# Patient Record
Sex: Male | Born: 1965
Health system: Southern US, Community
[De-identification: ages and names within clinical notes are randomized; demographics above are authoritative.]

---

## 1999-09-07 ENCOUNTER — Emergency Department (HOSPITAL_COMMUNITY): Admission: EM | Admit: 1999-09-07 | Discharge: 1999-09-08 | Payer: Self-pay

## 2001-03-13 ENCOUNTER — Encounter: Admission: RE | Admit: 2001-03-13 | Discharge: 2001-03-13 | Payer: Self-pay | Admitting: General Practice

## 2001-03-13 ENCOUNTER — Encounter: Payer: Self-pay | Admitting: General Practice

## 2001-07-16 ENCOUNTER — Emergency Department (HOSPITAL_COMMUNITY): Admission: EM | Admit: 2001-07-16 | Discharge: 2001-07-17 | Payer: Self-pay | Admitting: Emergency Medicine

## 2008-08-22 ENCOUNTER — Emergency Department (HOSPITAL_COMMUNITY): Admission: EM | Admit: 2008-08-22 | Discharge: 2008-08-22 | Payer: Self-pay | Admitting: Emergency Medicine

## 2009-07-02 ENCOUNTER — Emergency Department (HOSPITAL_COMMUNITY): Admission: EM | Admit: 2009-07-02 | Discharge: 2009-07-02 | Payer: Self-pay | Admitting: Emergency Medicine

## 2009-07-20 ENCOUNTER — Emergency Department (HOSPITAL_COMMUNITY): Admission: EM | Admit: 2009-07-20 | Discharge: 2009-07-20 | Payer: Self-pay | Admitting: Emergency Medicine

## 2009-11-07 ENCOUNTER — Emergency Department (HOSPITAL_COMMUNITY): Admission: EM | Admit: 2009-11-07 | Discharge: 2009-11-07 | Payer: Self-pay | Admitting: Emergency Medicine

## 2013-02-14 ENCOUNTER — Emergency Department (HOSPITAL_COMMUNITY)
Admission: EM | Admit: 2013-02-14 | Discharge: 2013-02-14 | Disposition: A | Discharge status: Home / Self Care | Payer: Self-pay | Attending: Emergency Medicine | Admitting: Emergency Medicine

## 2013-02-14 ENCOUNTER — Encounter (HOSPITAL_COMMUNITY): Payer: Self-pay

## 2013-02-14 DIAGNOSIS — R269 Unspecified abnormalities of gait and mobility: Secondary | ICD-10-CM | POA: Insufficient documentation

## 2013-02-14 DIAGNOSIS — F17200 Nicotine dependence, unspecified, uncomplicated: Secondary | ICD-10-CM | POA: Insufficient documentation

## 2013-02-14 DIAGNOSIS — M722 Plantar fascial fibromatosis: Secondary | ICD-10-CM | POA: Insufficient documentation

## 2013-02-14 DIAGNOSIS — IMO0001 Reserved for inherently not codable concepts without codable children: Secondary | ICD-10-CM | POA: Insufficient documentation

## 2013-02-14 MED ORDER — MELOXICAM 7.5 MG PO TABS: 7.5000 mg | ORAL_TABLET | Freq: Every day | ORAL | Status: DC

## 2013-02-14 MED ORDER — OXYCODONE-ACETAMINOPHEN 5-325 MG PO TABS: 1.0000 | ORAL_TABLET | Freq: Four times a day (QID) | ORAL | Status: DC | PRN

## 2013-02-14 NOTE — ED Provider Notes (Signed)
History    CSN: [id number] Arrival date & time 02/14/13  0930  First MD Initiated Contact with Patient 02/14/13 [phone]     Chief Complaint  Patient presents with  . Foot Pain   (Consider location/radiation/quality/duration/timing/severity/associated sxs/prior Treatment) HPI Comments: Patient presents with complaint of right foot pain for the past 3 weeks. Patient denies injury. Pain radiates to right calf at times. It is on the plantar aspect of his foot. It is worse with ambulation and worse on the first steps in the morning. Patient bought gel insoles which help. Patient took a family members Percocet with some relief. Onset of symptoms acute. Course is intermittent.  Patient is a 47 y.o. male presenting with lower extremity pain. The history is provided by the patient.  Foot Pain Associated symptoms include myalgias. Pertinent negatives include no arthralgias, joint swelling, neck pain, numbness or weakness.   History reviewed. No pertinent past medical history. History reviewed. No pertinent past surgical history. History reviewed. No pertinent family history. History  Substance Use Topics  . Smoking status: Current Every Day Smoker -- 0.50 packs/day    Types: Cigarettes  . Smokeless tobacco: Not on file  . Alcohol Use: 0.6 oz/week    1 Cans of beer per week    Review of Systems  Constitutional: Negative for activity change.  HENT: Negative for neck pain.   Musculoskeletal: Positive for myalgias and gait problem. Negative for back pain, joint swelling and arthralgias.  Skin: Negative for wound.  Neurological: Negative for weakness and numbness.    Allergies  Review of patient's allergies indicates no known allergies.  Home Medications   Current Outpatient Rx  Name  Route  Sig  Dispense  Refill  . meloxicam (MOBIC) 7.5 MG tablet   Oral   Take 1 tablet (7.5 mg total) by mouth daily.   14 tablet   0   . oxyCODONE-acetaminophen (PERCOCET/ROXICET) 5-325 MG per  tablet   Oral   Take 1-2 tablets by mouth every 6 (six) hours as needed for pain.   10 tablet   0    BP 141/99  Pulse 66  Temp(Src) 97.8 F (36.6 C) (Oral)  Resp 18  SpO2 97% Physical Exam  Nursing note and vitals reviewed. Constitutional: He appears well-developed and well-nourished.  HENT:  Head: Normocephalic and atraumatic.  Eyes: Conjunctivae are normal.  Neck: Normal range of motion. Neck supple.  Cardiovascular: Normal pulses.   Musculoskeletal: He exhibits tenderness. He exhibits no edema.       Right ankle: Normal.       Right foot: He exhibits tenderness. He exhibits normal range of motion and no bony tenderness.  Tenderness to palpation of plantar aspect of right foot.  Neurological: He is alert. No sensory deficit.  Motor, sensation, and vascular distal to the injury is fully intact.   Skin: Skin is warm and dry.  Psychiatric: He has a normal mood and affect.    ED Course  Procedures (including critical care time) Labs Reviewed - No data to display No results found. 1. Plantar fasciitis    9:57 AM Patient seen and examined.   Vital signs reviewed and are as follows: Filed Vitals:   02/14/13 0939  BP: 141/99  Pulse: 66  Temp: 97.8 F (36.6 C)  Resp: 18   Patient given post-op shoe per his request. Will give prescription pain med and NSAIDs. Ortho/podiatry f/u given and encouraged.   Patient counseled on use of narcotic pain medications. Counseled not to  combine these medications with others containing tylenol. Urged not to drink alcohol, drive, or perform any other activities that requires focus while taking these medications. The patient verbalizes understanding and agrees with the plan.   MDM  Patient history and exam consistent with plantar fasciitis. Patient prescribed NSAIDs, pain medication. Ortho f/u and conservative management.   Renne Crigler, PA-C 02/14/13 1003

## 2013-02-14 NOTE — ED Notes (Signed)
He c/o plantar area pain radiating up into his post. Lower leg x 1 month.  He is in no distress.  He denies any trauma, nor any unusual recent activity.

## 2013-02-14 NOTE — ED Provider Notes (Signed)
Medical screening examination/treatment/procedure(s) were performed by non-physician practitioner and as supervising physician I was immediately available for consultation/collaboration. Devoria Albe, MD, FACEP    Ward Givens, MD 02/14/13 1010

## 2014-05-01 ENCOUNTER — Encounter (HOSPITAL_COMMUNITY): Payer: Self-pay | Admitting: Emergency Medicine

## 2014-05-01 ENCOUNTER — Emergency Department (INDEPENDENT_AMBULATORY_CARE_PROVIDER_SITE_OTHER)
Admission: EM | Admit: 2014-05-01 | Discharge: 2014-05-01 | Disposition: A | Discharge status: Home / Self Care | Payer: Self-pay | Source: Home / Self Care | Attending: Family Medicine | Admitting: Family Medicine

## 2014-05-01 DIAGNOSIS — M7121 Synovial cyst of popliteal space [Baker], right knee: Secondary | ICD-10-CM

## 2014-05-01 DIAGNOSIS — M7120 Synovial cyst of popliteal space [Baker], unspecified knee: Secondary | ICD-10-CM

## 2014-05-01 MED ORDER — HYDROCODONE-ACETAMINOPHEN 5-325 MG PO TABS: 1.0000 | ORAL_TABLET | Freq: Four times a day (QID) | ORAL | Status: DC | PRN

## 2014-05-01 MED ORDER — PREDNISONE 10 MG PO TABS: ORAL_TABLET | ORAL | Status: DC

## 2014-05-01 NOTE — ED Notes (Signed)
Denies known injury; pt is a Education administrator.  Started with painand swelling in right knee, radiating down right calf, 4 days ago with swelling.  C/O tenderness to posterior knee.  + Homan's.  Has tried ice and Aleve without any relief.

## 2014-05-01 NOTE — ED Provider Notes (Signed)
CSN: [id number]     Arrival date & time 05/01/14  1756 History   First MD Initiated Contact with Patient 05/01/14 1824     Chief Complaint  Patient presents with  . Knee Pain   (Consider location/radiation/quality/duration/timing/severity/associated sxs/prior Treatment) HPI  48 year old male presents complaining of right knee pain. For 4 days he has had progressively worsening right knee pain in the posterior knee and anterior knee just proximal and medial to the patella. He has some swelling in these areas as well. The pain is increased with any ambulation. 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 He denies any specific injury. He denies any recent air travel or long car trips. No history of DVT or PE. No previous history of knee pain or injuries. He is taking ibuprofen without relief.  History reviewed. No pertinent past medical history. History reviewed. No pertinent past surgical history. No family history on file. History  Substance Use Topics  . Smoking status: Current Every Day Smoker -- 0.50 packs/day    Types: Cigarettes  . Smokeless tobacco: Not on file  . Alcohol Use: No    Review of Systems  Musculoskeletal: Positive for arthralgias and joint swelling.       See history of present illness  All other systems reviewed and are negative.   Allergies  Review of patient's allergies indicates no known allergies.  Home Medications   Prior to Admission medications   Medication Sig Start Date End Date Taking? Authorizing Provider  HYDROcodone-acetaminophen (NORCO) 5-325 MG per tablet Take 1 tablet by mouth every 6 (six) hours as needed for severe pain. 05/01/14   Graylon Good, PA-C  ibuprofen (ADVIL,MOTRIN) 200 MG tablet Take 400 mg by mouth every 6 (six) hours as needed for pain.    Historical Provider, MD  meloxicam (MOBIC) 7.5 MG tablet Take 1 tablet (7.5 mg total) by mouth daily. 02/14/13    Renne Crigler, PA-C  oxyCODONE-acetaminophen (PERCOCET/ROXICET) 5-325 MG per tablet Take 1-2 tablets by mouth every 6 (six) hours as needed for pain. 02/14/13   Renne Crigler, PA-C  predniSONE (DELTASONE) 10 MG tablet 4 tabs PO QD for 4 days; 3 tabs PO QD for 3 days; 2 tabs PO QD for 2 days; 1 tab PO QD for 1 day 05/01/14   Graylon Good, PA-C   BP 167/105  Pulse 83  Temp(Src) 99.2 F (37.3 C) (Oral)  Resp 18  SpO2 99% Physical Exam  Nursing note and vitals reviewed. Constitutional: He is oriented to person, place, and time. He appears well-developed and well-nourished. No distress.  HENT:  Head: Normocephalic.  Cardiovascular:  Pulses:      Dorsalis pedis pulses are 2+ on the right side.  No calf tenderness. Negative Homans sign  Pulmonary/Chest: Effort normal. No respiratory distress.  Musculoskeletal:       Right knee: He exhibits swelling (Focal swelling and tenderness in the Popliteal space consistent with popliteal bursitis) and effusion (minimal suprapatellar effusion). He exhibits normal range of motion, no deformity, no erythema, normal alignment, no LCL laxity, no bony tenderness, normal meniscus and no MCL laxity. Tenderness (proximal and medial to the patella) found.       Right lower leg: Normal.  Neurological: He is alert and oriented to person, place, and time. Coordination normal.  Skin: Skin is warm and dry. No rash noted. He is not diaphoretic.  Psychiatric: He has  a normal mood and affect. Judgment normal.    ED Course  Procedures (including critical care time) Labs Review Labs Reviewed - No data to display  Imaging Review No results found.   MDM   1. Lashunta Frieden cyst, right    He has filled in states, will prescribe 10 day course of prednisone, Norco when necessary, ice, rest. Followup with orthopedics if still no improvement.   Meds ordered this encounter  Medications  . predniSONE (DELTASONE) 10 MG tablet    Sig: 4 tabs PO QD for 4 days; 3 tabs PO QD  for 3 days; 2 tabs PO QD for 2 days; 1 tab PO QD for 1 day    Dispense:  30 tablet    Refill:  0    Order Specific Question:  Supervising Provider    Answer:  Lorenz Coaster, DAVID C [id number]  . HYDROcodone-acetaminophen (NORCO) 5-325 MG per tablet    Sig: Take 1 tablet by mouth every 6 (six) hours as needed for severe pain.    Dispense:  15 tablet    Refill:  0    Order Specific Question:  Supervising Provider    Answer:  Lorenz Coaster, DAVID C [[id number]]       Graylon Good, PA-C 05/01/14 2005

## 2014-05-01 NOTE — Discharge Instructions (Signed)
Peter Friedman Cyst °A Peter Friedman cyst is a sac-like structure that forms in the back of the knee. It is filled with the same fluid that is located in your knee. This fluid lubricates the bones and cartilage of the knee and allows them to move over each other more easily. °CAUSES  °When the knee becomes injured or inflamed, increased fluid forms in the knee. When this happens, the joint lining is pushed out behind the knee and forms the Peter Friedman cyst. This cyst may also be caused by inflammation from arthritic conditions and infections. °SIGNS AND SYMPTOMS  °A Peter Friedman cyst usually has no symptoms. When the cyst is substantially enlarged: °· You may feel pressure behind the knee, stiffness in the knee, or a mass in the area behind the knee. °· You may develop pain, redness, and swelling in the calf.  This can suggest a blood clot and requires evaluation by your health care provider. °DIAGNOSIS  °A Peter Friedman cyst is most often found during an ultrasound exam. This exam may have been performed for other reasons, and the cyst was found incidentally. Sometimes an MRI is used. This picks up other problems within a joint that an ultrasound exam may not. If the Peter Friedman cyst developed immediately after an injury, X-ray exams may be used to diagnose the cyst. °TREATMENT  °The treatment depends on the cause of the cyst. Anti-inflammatory medicines and rest often will be prescribed. If the cyst is caused by a bacterial infection, antibiotic medicines may be prescribed.  °HOME CARE INSTRUCTIONS  °· If the cyst was caused by an injury, for the first 24 hours, keep the injured leg elevated on 2 pillows while lying down. °· For the first 24 hours while you are awake, apply ice to the injured area: °¨ Put ice in a plastic bag. °¨ Place a towel between your skin and the bag. °¨ Leave the ice on for 20 minutes, 2-3 times a day. °· Only take over-the-counter or prescription medicines for pain, discomfort, or fever as directed by your health care  provider. °· Only take antibiotic medicine as directed. Make sure to finish it even if you start to feel better. °MAKE SURE YOU:  °· Understand these instructions. °· Will watch your condition. °· Will get help right away if you are not doing well or get worse. °Document Released: 08/05/2005 Document Revised: 05/26/2013 Document Reviewed: 03/17/2013 °Exit[employer]® Patient Information ©2015 [employer]. This information is not intended to replace advice given to you by your health care provider. Make sure you discuss any questions you have with your health care provider. ° °

## 2014-05-02 NOTE — ED Provider Notes (Signed)
Medical screening examination/treatment/procedure(s) were performed by a resident physician or non-physician practitioner and as the supervising physician I was immediately available for consultation/collaboration.  Ameerah Huffstetler, MD Family Medicine   Priyanka Causey J Clement Deneault, MD 05/02/14 0828 

## 2014-06-15 ENCOUNTER — Encounter: Payer: Self-pay | Admitting: Sports Medicine

## 2014-06-15 ENCOUNTER — Ambulatory Visit
Admission: RE | Admit: 2014-06-15 | Discharge: 2014-06-15 | Disposition: A | Discharge status: Home / Self Care | Payer: No Typology Code available for payment source | Source: Ambulatory Visit | Attending: Sports Medicine | Admitting: Sports Medicine

## 2014-06-15 ENCOUNTER — Ambulatory Visit (INDEPENDENT_AMBULATORY_CARE_PROVIDER_SITE_OTHER): Payer: Self-pay | Admitting: Sports Medicine

## 2014-06-15 VITALS — BP 152/101 | HR 91 | Ht 69.0 in | Wt 240.0 lb

## 2014-06-15 DIAGNOSIS — M25561 Pain in right knee: Secondary | ICD-10-CM

## 2014-06-15 MED ORDER — METHYLPREDNISOLONE ACETATE 40 MG/ML IJ SUSP
40.0000 mg | Freq: Once | INTRAMUSCULAR | Status: AC
  Administered 2014-06-15: 40 mg via INTRA_ARTICULAR

## 2014-06-15 NOTE — Progress Notes (Signed)
Peter LeasDarin L Abad - 48 y.o. male MRN [id number]  Date of birth: July 25, 1966  CC & HPI:  The patient presents for evaluation of: Right knee pain: Patient reports a 4+ week onset of right anterior knee pain that is worse with activity. He denies any trauma. 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 He reports occasional clicking and locking but the pain is centrally located in the knee. No significant posterior knee pain. He does check there has been some swelling in the knee. He was seen in the urgent care and diagnosed with popliteal bursitis and started on anti-inflammatories. He denies any prior trauma to his knee or prior surgeries. He does report being active as a teenager but no significant injury. He denies any fevers, chills or recent weight loss  ROS:  Per HPI.   HISTORY: Past Medical, Surgical, Social, and Family History Reviewed & Updated per EMR.  Pertinent Historical Findings include: Otherwise healthy, no diabetes, no current every day medications Works as a Education administratorpainter Current every day smoker   OBJECTIVE FINDINGS:  VS:   HT:5\' 9"  (175.3 cm)   WT:240 lb (108.863 kg)  BMI:35.5          BP:152/101 mmHg  HR:91bpm  TEMP: ( )  RESP:   PHYSICAL EXAM: GENERAL:  adult African-American male. In no discomfort; no respiratory distress   PSYCH: alert and appropriate, good insight   NEURO: Sensation is intact to light touch in bilateral lower extremities   VASCULAR:  right posterior tibialis pulses 2+/4.  No significant edema.   Right knee EXAM: Appearance:  overall normal-appearing, small amount of suprapatellar swelling with no significant effusion, no appreciable Baker's cyst   Skin: No overlying erythema/ecchymosis.  Palpation: TTP over: Medial patellar facet, medial joint line  No TTP over: Lateral joint line, patellar tendon origin or insertion   Strength & ROM:  knee ROM: +5-+120 Weakness in:  Extensor mechanism at 4+/5.  Knee flexion, ankle plantar and dorsiflexion 5+/5  Special Tests:  knee is stable to varus and valgus strain, anterior posterior drawer. Normal Lockman's.       Limited MSK Ultrasound of Right Knee: Findings:  no significant effusion, intact extensor mechanism. No significant disruption in patellar tendon although there is some hypoechoic change along the medial superficial aspect of the origin and insertion. Lateral meniscus is normal appearing with no hypoechoic change. Medial meniscus is degenerative appearing with significant hypoechoic change that is tracking along the MCL. MCL intact.   Impression: The above findings are consistent with degenerative medial meniscus tear    ASSESSMENT: 1. Right knee pain   Likely degenerative meniscus  PROCEDURE NOTE : Right Knee Intra-articular Injection After discussing the risks, benefits and expected outcomes of the injection and all questions were reviewed and answered,  he wished to undergo the above named procedure.  Written consent was obtained. After an appropriate time out was taken the right knee was sterilely prepped and injected as below: Prep:    Betadine and alcohol,  Ethel chloride.  Approach:  Direct bent knee lateral portal Needle:  22g 1.5inch Meds:   3cc 1% lidocaine; 1cc 40mg  depomedrol A bandaid was applied to the area. This procedure was well tolerated and there were no complications.    PLAN: See problem based charting & AVS for additional documentation. - Intra-articular injection today. - Plain film x-rays to evaluate for underlying degenerative changes. -  If not improved will need MRI and consideration of arthroscopic debridement. > Return in about 4 weeks (around 07/13/2014).

## 2014-07-13 ENCOUNTER — Encounter: Payer: Self-pay | Admitting: Sports Medicine

## 2014-07-13 ENCOUNTER — Ambulatory Visit (INDEPENDENT_AMBULATORY_CARE_PROVIDER_SITE_OTHER): Payer: No Typology Code available for payment source | Admitting: Sports Medicine

## 2014-07-13 VITALS — BP 151/89 | HR 73 | Ht 69.0 in | Wt 240.0 lb

## 2014-07-13 DIAGNOSIS — G8929 Other chronic pain: Secondary | ICD-10-CM | POA: Insufficient documentation

## 2014-07-13 DIAGNOSIS — M25561 Pain in right knee: Secondary | ICD-10-CM

## 2014-07-13 MED ORDER — TRAMADOL HCL 50 MG PO TABS: 50.0000 mg | ORAL_TABLET | Freq: Four times a day (QID) | ORAL | Status: DC | PRN

## 2014-07-13 NOTE — Progress Notes (Signed)
  Arbutus LeasDarin L Friedman - 48 y.o. male MRN [id number]  Date of birth: 1965-09-11  CC & HPI:  Sahil presents for follow-up of: Right medial knee pain: Overall reports 50% improvement following intra-articular injection but still continuing to have mechanical symptoms including giving way. Denies locking or clicking. Denies fevers, chills or significant swelling of the knee. Has been performing quadriceps strengthening HEP  ROS:  Per HPI.   HISTORY: Past Medical, Surgical, Social, and Family History Reviewed & Updated per EMR.  Pertinent Historical Findings include: Otherwise relatively healthy. Current everyday smoker. 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  Historical Data Reviewed: Weight bearing 4 view x-ray of the knee reviewed today: No significant degenerative changes there is early spurring of the patellofemoral groove. There is no significant loss of joint space.   OBJECTIVE:  VS:   HT:5\' 9"  (175.3 cm)   WT:240 lb (108.863 kg)  BMI:35.5          BP:(!) 151/89 mmHg  HR:73bpm  TEMP: ( )  RESP:   PHYSICAL EXAM: GENERAL:  adult African-American male. In no discomfort; no respiratory distress   PSYCH: alert and appropriate, good insight   NEURO: sensation is intact to light touch in right lower extremity   VASCULAR: R PT pulse 2+/4.  No significant edema.   Right knee EXAM: Overall normal-appearing, well aligned.. No overlying erythema/ecchymosis.  TTP over: Medial joint line.  No TTP over: Lateral joint line, gastroc or hamstring insertion. No Pez TTP.  Extensor mechanism 5/5, flexor mechanism 4/5. Positive Thessaly and McMurray's. Stable to varus and valgus strain.    Limited MSK Ultrasound of Right Knee: Findings:  hypoechoic change and mushroom sign of medial meniscus   Impression: The above findings are consistent with degenerative medial meniscus tear     ASSESSMENT: 1. Right knee pain   Likely medial meniscus tear with mechanical symptoms  PLAN: See  problem based charting & AVS for additional documentation.  MRI of the need to better characterize medial meniscus.  PT referral to work on continued quad strengthening and hamstring strengthening given slightly weak knee flexion.  Prescription for tramadol to be used on a when necessary basis. Discussed will not be escalating this therapy as chronic opioids are not appropriate for this condition.  Cautioned on mechanical symptoms interfering with daily activities especially while on a ladder and patient aware of this risk. > If MRI does show definitive tearing will try to set up referral to orthopedics however given [facility] options are somewhat limited.  > Return in about 6 weeks (around 08/24/2014).

## 2014-07-18 ENCOUNTER — Ambulatory Visit
Admission: RE | Admit: 2014-07-18 | Discharge: 2014-07-18 | Disposition: A | Discharge status: Home / Self Care | Payer: No Typology Code available for payment source | Source: Ambulatory Visit | Attending: Sports Medicine | Admitting: Sports Medicine

## 2014-07-18 DIAGNOSIS — M25561 Pain in right knee: Secondary | ICD-10-CM

## 2014-08-24 ENCOUNTER — Ambulatory Visit (INDEPENDENT_AMBULATORY_CARE_PROVIDER_SITE_OTHER): Payer: No Typology Code available for payment source | Admitting: Sports Medicine

## 2014-08-24 ENCOUNTER — Encounter: Payer: Self-pay | Admitting: Sports Medicine

## 2014-08-24 VITALS — BP 151/98 | Ht 69.0 in | Wt 240.0 lb

## 2014-08-24 DIAGNOSIS — M23231 Derangement of other medial meniscus due to old tear or injury, right knee: Secondary | ICD-10-CM

## 2014-08-24 DIAGNOSIS — M25561 Pain in right knee: Secondary | ICD-10-CM

## 2014-08-24 DIAGNOSIS — M23203 Derangement of unspecified medial meniscus due to old tear or injury, right knee: Secondary | ICD-10-CM

## 2014-08-24 DIAGNOSIS — Z720 Tobacco use: Secondary | ICD-10-CM

## 2014-08-24 NOTE — Progress Notes (Signed)
  Arbutus LeasDarin L Creamer - 49 y.o. male MRN [id number]  Date of birth: 06/19/1966  CC & HPI:  Peter Friedman presents for follow-up of: Right medial knee pain: Overall reports 50% improvement following intra-articular injection but still continuing to have mechanical symptoms including giving way. Denies locking or clicking. Denies fevers, chills or significant swelling of the knee. Has been performing quadriceps strengthening HEP  ROS:  Per HPI.   HISTORY: Past Medical, Surgical, Social, and Family History Reviewed & Updated per EMR.  Pertinent Historical Findings include: Otherwise relatively healthy. Current everyday smoker. 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  Historical Data Reviewed: Weight bearing 4 view x-ray of the knee reviewed today: No significant degenerative changes there is early spurring of the patellofemoral groove. There is no significant loss of joint space.   MRI 07/18/14: Mid body and posterior on medial meniscal tear. 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  OBJECTIVE:  VS:   HT:5\' 9"  (175.3 cm)   WT:240 lb (108.863 kg)  BMI:35.5          BP:(!) 151/98 mmHg  HR: bpm  TEMP: ( )  RESP:   PHYSICAL EXAM: GENERAL: adult African-American male. In no discomfort; no respiratory distress   PSYCH: alert and appropriate, good insight   NEURO: sensation is intact to light touch in right lower extremity   VASCULAR: R PT pulse 2+/4.  No significant edema.   Right knee EXAM: Overall normal-appearing, well aligned.. No overlying erythema/ecchymosis.  TTP over: Medial joint line.  No TTP over: Lateral joint line, gastroc or hamstring insertion. No Pez TTP.  Extensor mechanism 5/5, flexor mechanism 4/5. Positive Thessaly and McMurray's. Stable to varus and valgus strain.   ASSESSMENT: 1. Right knee pain   2. Degenerative tear of medial meniscus of right knee   3. Tobacco abuse   Likely medial meniscus tear with mechanical  symptoms  PLAN: See problem based charting & AVS for additional documentation.  After discussing options we will [facility] given [facility].  Discussed likely out of pocket expense and he is able to make these accomidations  Counseled on importance of smoking cessation prior to surgery and pt reports currently trying to cut down  HEP: continue quad sets and straight leg raises.   Prescription for tramadol to be used on a when necessary basis. Discussed will not be escalating this therapy as chronic opioids are not appropriate for this condition.  Cautioned on mechanical symptoms interfering with daily activities especially while on a ladder and patient aware of this risk. > Return if symptoms worsen or fail to improve.    > [facility]

## 2014-09-08 ENCOUNTER — Telehealth: Payer: Self-pay | Admitting: *Deleted

## 2014-09-08 NOTE — Telephone Encounter (Signed)
Talked to pt about his [facility].  His appt is 09/20/14.  He will follow up with us as needed

## 2014-09-08 NOTE — Telephone Encounter (Signed)
-----   Message from Lizbeth BarkMelanie L Ceresi sent at 09/07/2014  2:16 PM EST ----- Regarding: FW: phone message Contact: [phone] Pt called asking for status on this request. Also pt wants to discuss tramadol not helping pain in his knees. ----- Message -----    From: Lizbeth BarkMelanie L Ceresi    Sent: 09/06/2014  10:40 AM      To: Linward Headlandhea N Arlita Buffkin, RN Subject: phone message                                  Pt states [facility] hasnt not got the referral from dr. Berline Choughigby. Please refax and call pt with status.

## 2014-10-11 DIAGNOSIS — M1710 Unilateral primary osteoarthritis, unspecified knee: Secondary | ICD-10-CM | POA: Insufficient documentation

## 2014-10-11 DIAGNOSIS — M179 Osteoarthritis of knee, unspecified: Secondary | ICD-10-CM | POA: Insufficient documentation

## 2015-02-17 HISTORY — PX: KNEE ARTHROSCOPY: SHX127

## 2015-10-27 ENCOUNTER — Encounter: Payer: Self-pay | Admitting: Family Medicine

## 2015-10-27 ENCOUNTER — Ambulatory Visit: Payer: No Typology Code available for payment source | Attending: Family Medicine | Admitting: Family Medicine

## 2015-10-27 ENCOUNTER — Encounter: Payer: Self-pay | Admitting: Clinical

## 2015-10-27 VITALS — BP 135/76 | HR 88 | Temp 98.5°F | Resp 16 | Ht 69.0 in | Wt 250.0 lb

## 2015-10-27 DIAGNOSIS — M25561 Pain in right knee: Secondary | ICD-10-CM | POA: Insufficient documentation

## 2015-10-27 DIAGNOSIS — M23231 Derangement of other medial meniscus due to old tear or injury, right knee: Secondary | ICD-10-CM

## 2015-10-27 DIAGNOSIS — G8929 Other chronic pain: Secondary | ICD-10-CM | POA: Insufficient documentation

## 2015-10-27 DIAGNOSIS — Z720 Tobacco use: Secondary | ICD-10-CM

## 2015-10-27 DIAGNOSIS — Z9889 Other specified postprocedural states: Secondary | ICD-10-CM | POA: Insufficient documentation

## 2015-10-27 DIAGNOSIS — M23203 Derangement of unspecified medial meniscus due to old tear or injury, right knee: Secondary | ICD-10-CM

## 2015-10-27 DIAGNOSIS — F17210 Nicotine dependence, cigarettes, uncomplicated: Secondary | ICD-10-CM | POA: Insufficient documentation

## 2015-10-27 DIAGNOSIS — R7303 Prediabetes: Secondary | ICD-10-CM | POA: Insufficient documentation

## 2015-10-27 DIAGNOSIS — Z0000 Encounter for general adult medical examination without abnormal findings: Secondary | ICD-10-CM

## 2015-10-27 LAB — POCT GLYCOSYLATED HEMOGLOBIN (HGB A1C): Hemoglobin A1C: 6.1

## 2015-10-27 MED ORDER — ALBUTEROL SULFATE HFA 108 (90 BASE) MCG/ACT IN AERS: 2.0000 | INHALATION_SPRAY | Freq: Four times a day (QID) | RESPIRATORY_TRACT | Status: DC | PRN

## 2015-10-27 MED ORDER — BUPROPION HCL ER (XL) 150 MG PO TB24: 150.0000 mg | ORAL_TABLET | Freq: Every day | ORAL | Status: DC

## 2015-10-27 MED FILL — VENTOLIN HFA 90 MCG INHALER: 108 (90 BAS | 30 days supply | Qty: 18 | Fill #0

## 2015-10-27 MED FILL — ?BUPROPION HCL XL 150 MG TA: 150 | 30 days supply | Qty: 30 | Fill #0

## 2015-10-27 NOTE — Progress Notes (Signed)
LOGO@  Subjective:  Patient ID: Peter Friedman, male    DOB: 09-Mar-1966  Age: 50 y.o. MRN: [id number]  CC: Establish Care   HPI Peter Friedman presents for    1. R knee pain: chronic pain for the past 2 years. He has hx of medial meniscal tear. Underwent scope in 02/2015. He has experienced worsening pain in his R knee. He has been unable to work for the past year. He worked as a Education administrator. He is unable to stoop and climb. He wears a brace daily as he reports joint laxity. No recent falls. He does not use a cane. His orthopaedic surgeon has referred him to pain management.   2. Smoking: he smoked 1 PPD for 20+ years. He is down to 3-4 cigs per day. He admits to wheezing. He denies fever, chills, cough and SOB. His brother recently died of lung cancer. Patient give his of bronchitis as a child. He would like to quit smoking.    History reviewed. No pertinent past medical history.  Past Surgical History  Procedure Laterality Date  . Knee arthroscopy Right 02/2015    Family History  Problem Relation Age of Onset  . Hypertension Mother   . Hypertension Father   . Hypertension Sister   . Lung cancer Brother     Social History  Substance Use Topics  . Smoking status: Current Every Day Smoker -- 0.50 packs/day    Types: Cigarettes  . Smokeless tobacco: Not on file  . Alcohol Use: No    ROS Review of Systems  Constitutional: Negative for fever, chills, fatigue and unexpected weight change.  Eyes: Negative for visual disturbance.  Respiratory: Positive for wheezing. Negative for cough and shortness of breath.   Cardiovascular: Negative for chest pain, palpitations and leg swelling.  Gastrointestinal: Negative for nausea, vomiting, abdominal pain, diarrhea, constipation and blood in stool.  Endocrine: Negative for polydipsia, polyphagia and polyuria.  Musculoskeletal: Positive for arthralgias. Negative for myalgias, back pain, gait problem and neck pain.  Skin: Negative for rash.   Allergic/Immunologic: Negative for immunocompromised state.  Hematological: Negative for adenopathy. Does not bruise/bleed easily.  Psychiatric/Behavioral: Negative for suicidal ideas, sleep disturbance and dysphoric mood. The patient is not nervous/anxious.    Depression screen [facility] 2/9 10/27/2015 08/24/2014 06/15/2014  Decreased Interest 1 0 0  Down, Depressed, Hopeless 1 0 0  PHQ - 2 Score 2 0 0  Altered sleeping 1 - -  Tired, decreased energy 1 - -  Change in appetite 0 - -  Feeling bad or failure about yourself  0 - -  Trouble concentrating 0 - -  Moving slowly or fidgety/restless 0 - -  Suicidal thoughts 0 - -  PHQ-9 Score 4 - -   GAD 7 : Generalized Anxiety Score 10/27/2015  Nervous, Anxious, on Edge 1  Control/stop worrying 1  Worry too much - different things 1  Trouble relaxing 1  Restless 0  Easily annoyed or irritable 0  Afraid - awful might happen 0  Total GAD 7 Score 4      Objective:   Today's Vitals: BP 135/76 mmHg  Pulse 88  Temp(Src) 98.5 F (36.9 C) (Oral)  Resp 16  Ht  (1.753 m)  Wt 250 lb (113.399 kg)  BMI 36.90 kg/m2  SpO2 98%  Physical Exam  Constitutional: He appears well-developed and well-nourished. No distress.  HENT:  Head: Normocephalic and atraumatic.  Neck: Normal range of motion. Neck supple.  Cardiovascular: Normal rate, regular  rhythm, normal heart sounds and intact distal pulses.   Pulmonary/Chest: Effort normal. No respiratory distress. He has wheezes. He has no rales. He exhibits no tenderness.  Musculoskeletal: He exhibits no edema.  Wearing brace on R knee   Neurological: He is alert.  Skin: Skin is warm and dry. No rash noted. No erythema.  Psychiatric: He has a normal mood and affect.   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 Duffy BruceGary Poehling        Other Visit Diagnoses    Healthcare maintenance    -  Primary    Relevant Orders    HgB A1c (Completed)       Outpatient Encounter Prescriptions as of 10/27/2015  Medication Sig  . oxycodone (OXY-IR) 5 MG capsule Take 5 mg by mouth every 4 (four) hours as needed.  . [DISCONTINUED] naproxen sodium (RA NAPROXEN SODIUM) 220 MG tablet Take 220 mg by mouth 3 (three) times daily as needed.  . [DISCONTINUED] traMADol (ULTRAM) 50 MG tablet Take 1 tablet (50 mg total) by mouth every 6 (six) hours as needed.  . [DISCONTINUED] traZODone (DESYREL) 50 MG tablet Take 50 mg by mouth at bedtime. Reported on 10/27/2015   No facility-administered encounter medications on file as of 10/27/2015.    Follow-up: No Follow-up on file.    Dessa PhiJosalyn Marquetta Weiskopf MD

## 2015-10-27 NOTE — Patient Instructions (Addendum)
Peter Friedman was seen today for establish care.  Diagnoses and all orders for this visit:  Healthcare maintenance -     HgB A1c  Degenerative tear of medial meniscus of right knee  Tobacco abuse -     DG Chest 2 View; Future -     albuterol (PROVENTIL HFA;VENTOLIN HFA) 108 (90 Base) MCG/ACT inhaler; Inhale 2 puffs into the lungs every 6 (six) hours as needed for wheezing or shortness of breath. -     buPROPion (WELLBUTRIN XL) 150 MG 24 hr tablet; Take 1 tablet (150 mg total) by mouth daily.  Prediabetes  start wellbutrin 150 mg daily for one week, increase to 300 mg after one week if tolerating well Set smoking quit date from 7-35 days after starting wellbutrin. Plan to take the medicine for 7-12 weeks, then we will taper off   F/u in 4 weeks for smoking cessation  Dr. Armen PickupFunches

## 2015-10-27 NOTE — Assessment & Plan Note (Signed)
A: chronic R knee pain P: pain management per Dr. Duffy BruceGary Poehling

## 2015-10-27 NOTE — Progress Notes (Signed)
Establish Care  Possible elevated blood pressure  Tobacco user 2-3 cigarette per day  No pain today  No suicidal thought in the past two weeks

## 2015-10-27 NOTE — Addendum Note (Signed)
Addended by: Dessa PhiFUNCHES, Beverlyann Broxterman on: 10/27/2015 12:38 PM   Modules accepted: Orders

## 2015-10-27 NOTE — Progress Notes (Signed)
[facility] 10/27/2015 08/24/2014 06/15/2014  Decreased Interest 1 0 0  Down, Depressed, Hopeless 1 0 0  PHQ - 2 Score 2 0 0  Altered sleeping 1 - -  Tired, decreased energy 1 - -  Change in appetite 0 - -  Feeling bad or failure about yourself  0 - -  Trouble concentrating 0 - -  Moving slowly or fidgety/restless 0 - -  Suicidal thoughts 0 - -  PHQ-9 Score 4 - -    GAD 7 : Generalized Anxiety Score 10/27/2015  Nervous, Anxious, on Edge 1  Control/stop worrying 1  Worry too much - different things 1  Trouble relaxing 1  Restless 0  Easily annoyed or irritable 0  Afraid - awful might happen 0  Total GAD 7 Score 4

## 2015-11-01 ENCOUNTER — Ambulatory Visit (HOSPITAL_COMMUNITY)
Admission: RE | Admit: 2015-11-01 | Discharge: 2015-11-01 | Disposition: A | Discharge status: Home / Self Care | Payer: No Typology Code available for payment source | Source: Ambulatory Visit | Attending: Family Medicine | Admitting: Family Medicine

## 2015-11-01 DIAGNOSIS — Z720 Tobacco use: Secondary | ICD-10-CM | POA: Insufficient documentation

## 2015-11-01 DIAGNOSIS — R062 Wheezing: Secondary | ICD-10-CM | POA: Insufficient documentation

## 2015-11-08 ENCOUNTER — Telehealth: Payer: Self-pay | Admitting: *Deleted

## 2015-11-08 NOTE — Telephone Encounter (Signed)
Date of birth verified by pt  Xray results given  Pt stated still wheezing but doing better

## 2015-11-08 NOTE — Telephone Encounter (Signed)
-----   Message from Dessa PhiJosalyn Funches, MD sent at 11/02/2015  9:09 AM EDT ----- Normal chest x-ray

## 2015-11-28 ENCOUNTER — Ambulatory Visit: Payer: No Typology Code available for payment source | Attending: Family Medicine | Admitting: Family Medicine

## 2015-11-28 ENCOUNTER — Encounter: Payer: Self-pay | Admitting: Family Medicine

## 2015-11-28 ENCOUNTER — Encounter: Payer: Self-pay | Admitting: Clinical

## 2015-11-28 VITALS — BP 149/85 | HR 69 | Temp 98.0°F | Resp 16 | Ht 69.0 in | Wt 251.0 lb

## 2015-11-28 DIAGNOSIS — M25561 Pain in right knee: Secondary | ICD-10-CM | POA: Insufficient documentation

## 2015-11-28 DIAGNOSIS — K0889 Other specified disorders of teeth and supporting structures: Secondary | ICD-10-CM | POA: Insufficient documentation

## 2015-11-28 DIAGNOSIS — Z720 Tobacco use: Secondary | ICD-10-CM

## 2015-11-28 DIAGNOSIS — S83241A Other tear of medial meniscus, current injury, right knee, initial encounter: Secondary | ICD-10-CM | POA: Insufficient documentation

## 2015-11-28 DIAGNOSIS — I10 Essential (primary) hypertension: Secondary | ICD-10-CM | POA: Insufficient documentation

## 2015-11-28 DIAGNOSIS — M23203 Derangement of unspecified medial meniscus due to old tear or injury, right knee: Secondary | ICD-10-CM

## 2015-11-28 DIAGNOSIS — Z79899 Other long term (current) drug therapy: Secondary | ICD-10-CM | POA: Insufficient documentation

## 2015-11-28 DIAGNOSIS — M23231 Derangement of other medial meniscus due to old tear or injury, right knee: Secondary | ICD-10-CM

## 2015-11-28 DIAGNOSIS — F17210 Nicotine dependence, cigarettes, uncomplicated: Secondary | ICD-10-CM | POA: Insufficient documentation

## 2015-11-28 MED ORDER — AMOXICILLIN 500 MG PO CAPS: 500.0000 mg | ORAL_CAPSULE | Freq: Three times a day (TID) | ORAL | Status: DC

## 2015-11-28 MED ORDER — GABAPENTIN 300 MG PO CAPS: 600.0000 mg | ORAL_CAPSULE | Freq: Three times a day (TID) | ORAL | Status: DC

## 2015-11-28 MED ORDER — AMLODIPINE BESYLATE 5 MG PO TABS: 5.0000 mg | ORAL_TABLET | Freq: Every day | ORAL | Status: DC

## 2015-11-28 MED ORDER — BUPROPION HCL ER (XL) 150 MG PO TB24: 300.0000 mg | ORAL_TABLET | Freq: Every day | ORAL | Status: DC

## 2015-11-28 MED FILL — AMLODIPINE BESYLATE 5 MG TA: 5 | 30 days supply | Qty: 30 | Fill #0

## 2015-11-28 MED FILL — AMOXICILLIN 500 MG CAPSULE: 500 | 10 days supply | Qty: 30 | Fill #0

## 2015-11-28 NOTE — Progress Notes (Signed)
Subjective:  Patient ID: Peter Friedman, male    DOB: 09-Oct-1965  Age: 50 y.o. MRN: [id number]  CC: Nicotine Dependence   HPI Peter Friedman presents for smoking cessation  1.  Nicotine dependence: using wellbutrin. Down to a few cigarettes per day. No cough, CP or SOB.  2. R knee pain: has known degenerative tear of medial meniscus of R knee. Still with burning sensation of R knee. His ortho start him on gabapentin 3 weeks ago. Pain persist without improvement. He continues to wear a brace.   3. Dental pain: has poor dentition. Has L upper molar pain. No fever or swelling. Has not seen a dentist in many years.   4. HTN: has hx of HTN at least since 2015. No treatment. Smoking. No HA, CP, SOB or swelling in legs.   Social History  Substance Use Topics  . Smoking status: Current Every Day Smoker -- 0.50 packs/day    Types: Cigarettes  . Smokeless tobacco: Never Used  . Alcohol Use: No   Outpatient Prescriptions Prior to Visit  Medication Sig Dispense Refill  . albuterol (PROVENTIL HFA;VENTOLIN HFA) 108 (90 Base) MCG/ACT inhaler Inhale 2 puffs into the lungs every 6 (six) hours as needed for wheezing or shortness of breath. 1 Inhaler 0  . buPROPion (WELLBUTRIN XL) 150 MG 24 hr tablet Take 1 tablet (150 mg total) by mouth daily. 30 tablet 2  . oxycodone (OXY-IR) 5 MG capsule Take 5 mg by mouth every 4 (four) hours as needed.     No facility-administered medications prior to visit.    ROS Review of Systems  Constitutional: Negative for fever, chills, fatigue and unexpected weight change.  HENT: Positive for dental problem.   Eyes: Negative for visual disturbance.  Respiratory: Negative for cough, shortness of breath and wheezing.   Cardiovascular: Negative for chest pain, palpitations and leg swelling.  Gastrointestinal: Negative for nausea, vomiting, abdominal pain, diarrhea, constipation and blood in stool.  Endocrine: Negative for polydipsia, polyphagia and polyuria.    Musculoskeletal: Positive for arthralgias. Negative for myalgias, back pain, gait problem and neck pain.  Skin: Negative for rash.  Allergic/Immunologic: Negative for immunocompromised state.  Hematological: Negative for adenopathy. Does not bruise/bleed easily.  Psychiatric/Behavioral: Negative for suicidal ideas, sleep disturbance and dysphoric mood. The patient is not nervous/anxious.     Objective:  BP 149/85 mmHg  Pulse 69  Temp(Src) 98 F (36.7 C) (Oral)  Resp 16  Ht  (1.753 m)  Wt 251 lb (113.853 kg)  BMI 37.05 kg/m2  SpO2 100%  BP/Weight 11/28/2015 10/27/2015 08/24/2014  Systolic BP 149 135 151  Diastolic BP 85 76 98  Wt. (Lbs) 251 250 240  BMI 37.05 36.9 35.43   Physical Exam  Constitutional: He appears well-developed and well-nourished. No distress.  HENT:  Head: Normocephalic and atraumatic.  Mouth/Throat: He does not have dentures. No oral lesions. No trismus in the jaw. Abnormal dentition. Dental caries present. No dental abscesses, uvula swelling or lacerations.    Neck: Normal range of motion. Neck supple.  Cardiovascular: Normal rate, regular rhythm, normal heart sounds and intact distal pulses.   Pulmonary/Chest: Effort normal and breath sounds normal.  Musculoskeletal: He exhibits no edema.       Legs: Neurological: He is alert.  Skin: Skin is warm and dry. No rash noted. No erythema.  Psychiatric: He has a normal mood and affect.     Assessment & Plan:   There are no diagnoses linked to this  encounter. Peter Friedman was seen today for nicotine dependence.  Diagnoses and all orders for this visit:  Right knee pain -     gabapentin (NEURONTIN) 300 MG capsule; Take 2 capsules (600 mg total) by mouth 3 (three) times daily.  Pain, dental -     amoxicillin (AMOXIL) 500 MG capsule; Take 1 capsule (500 mg total) by mouth 3 (three) times daily. -     Ambulatory referral to Dentistry  Tobacco abuse -     buPROPion (WELLBUTRIN XL) 150 MG 24 hr tablet; Take  2 tablets (300 mg total) by mouth daily.  Essential hypertension -     amLODipine (NORVASC) 5 MG tablet; Take 1 tablet (5 mg total) by mouth daily.   No orders of the defined types were placed in this encounter.    Follow-up: No Follow-up on file.   Dessa PhiJosalyn Amairani Shuey MD

## 2015-11-28 NOTE — Progress Notes (Signed)
F/U smocking cessation  Pt stated smoking less, down to 1 cigarette per day  Pain scale #6 - rt leg pain Dental Referral  No suicidal thoughts in the past two weeks

## 2015-11-28 NOTE — Assessment & Plan Note (Signed)
Dental pain with poor dentition and gum disease Dental referral amox course

## 2015-11-28 NOTE — Assessment & Plan Note (Signed)
Improving on wellbutrin  Continue wellbutrin Increase dose to 300 mg daily

## 2015-11-28 NOTE — Patient Instructions (Signed)
Peter Friedman was seen today for nicotine dependence.  Diagnoses and all orders for this visit:  Right knee pain -     gabapentin (NEURONTIN) 300 MG capsule; Take 2 capsules (600 mg total) by mouth 3 (three) times daily.  Pain, dental -     amoxicillin (AMOXIL) 500 MG capsule; Take 1 capsule (500 mg total) by mouth 3 (three) times daily. -     Ambulatory referral to Dentistry  Tobacco abuse -     buPROPion (WELLBUTRIN XL) 150 MG 24 hr tablet; Take 2 tablets (300 mg total) by mouth daily.  Essential hypertension -     amLODipine (NORVASC) 5 MG tablet; Take 1 tablet (5 mg total) by mouth daily.    Upon review of your vital signs you have intermittent hypertension. You were hypertensive today.  Start norvasc 5 mg once daily.  F/u in 6 weeks   Dr. Armen PickupFunches   Hypertension Hypertension is another name for high blood pressure. High blood pressure forces your heart to work harder to pump blood. A blood pressure reading has two numbers, which includes a higher number over a lower number (example: 110/72). HOME CARE   Have your blood pressure rechecked by your doctor.  Only take medicine as told by your doctor. Follow the directions carefully. The medicine does not work as well if you skip doses. Skipping doses also puts you at risk for problems.  Do not smoke.  Monitor your blood pressure at home as told by your doctor. GET HELP IF:  You think you are having a reaction to the medicine you are taking.  You have repeat headaches or feel dizzy.  You have puffiness (swelling) in your ankles.  You have trouble with your vision. GET HELP RIGHT AWAY IF:   You get a very bad headache and are confused.  You feel weak, numb, or faint.  You get chest or belly (abdominal) pain.  You throw up (vomit).  You cannot breathe very well. MAKE SURE YOU:   Understand these instructions.  Will watch your condition.  Will get help right away if you are not doing well or get worse.   This  information is not intended to replace advice given to you by your health care provider. Make sure you discuss any questions you have with your health care provider.   Document Released: 01/22/2008 Document Revised: 08/10/2013 Document Reviewed: 05/28/2013 Elsevier Interactive Patient Education [employer]2016 Elsevier Inc.

## 2015-11-28 NOTE — Progress Notes (Signed)
[facility] 2[facility] 11/28/2015 10/27/2015 08/24/2014 06/15/2014  Decreased Interest 2 1 0 0  Down, Depressed, Hopeless 2 1 0 0  PHQ - 2 Score 4 2 0 0  Altered sleeping 1 1 - -  Tired, decreased energy 1 1 - -  Change in appetite 0 0 - -  Feeling bad or failure about yourself  1 0 - -  Trouble concentrating 0 0 - -  Moving slowly or fidgety/restless 0 0 - -  Suicidal thoughts 0 0 - -  PHQ-9 Score 7 4 - -    GAD 7 : Generalized Anxiety Score 11/28/2015 10/27/2015  Nervous, Anxious, on Edge 2 1  Control/stop worrying 3 1  Worry too much - different things 3 1  Trouble relaxing 1 1  Restless 1 0  Easily annoyed or irritable 1 0  Afraid - awful might happen 0 0  Total GAD 7 Score 11 4

## 2015-11-28 NOTE — Assessment & Plan Note (Signed)
A" chronic R knee pain P: Treated with pain management Increase gabapentin to 600 mg TID

## 2015-11-28 NOTE — Assessment & Plan Note (Signed)
A; elevated and untreated P: Add norvasc 5 mg daily Counseled patient on low salt diet

## 2015-12-07 ENCOUNTER — Telehealth: Payer: Self-pay | Admitting: Family Medicine

## 2015-12-07 NOTE — Telephone Encounter (Signed)
Pt. Called requesting to speak to the nurse. Pt. Stated he has a couple of questions to ask his nurse. Please f/u

## 2015-12-08 NOTE — Telephone Encounter (Signed)
Pt requesting a letter for work stating  Need to be out of work for about 30 days  Pt still with back pain

## 2015-12-08 NOTE — Telephone Encounter (Signed)
Patient has knee pain on problem list I cannot write him out for 30 days without an OV and work up What is he doing for work now? Can his work accommodate light duty?

## 2015-12-18 NOTE — Telephone Encounter (Signed)
Pt notified  Unable to write out for 30 days  Schedule F/U appointment with pcp  Call transfer to front office for appointment

## 2015-12-26 MED FILL — ?AMLODIPINE BESYLATE 5 MG T: 5 | 30 days supply | Qty: 30 | Fill #1

## 2016-01-04 ENCOUNTER — Encounter: Payer: Self-pay | Admitting: Family Medicine

## 2016-01-04 ENCOUNTER — Ambulatory Visit: Payer: No Typology Code available for payment source | Attending: Family Medicine | Admitting: Family Medicine

## 2016-01-04 VITALS — BP 145/82 | HR 77 | Temp 98.5°F | Resp 16 | Ht 69.0 in | Wt 247.0 lb

## 2016-01-04 DIAGNOSIS — M545 Low back pain, unspecified: Secondary | ICD-10-CM | POA: Insufficient documentation

## 2016-01-04 DIAGNOSIS — S83241A Other tear of medial meniscus, current injury, right knee, initial encounter: Secondary | ICD-10-CM | POA: Insufficient documentation

## 2016-01-04 DIAGNOSIS — M23203 Derangement of unspecified medial meniscus due to old tear or injury, right knee: Secondary | ICD-10-CM

## 2016-01-04 DIAGNOSIS — M23231 Derangement of other medial meniscus due to old tear or injury, right knee: Secondary | ICD-10-CM

## 2016-01-04 DIAGNOSIS — F17210 Nicotine dependence, cigarettes, uncomplicated: Secondary | ICD-10-CM | POA: Insufficient documentation

## 2016-01-04 DIAGNOSIS — M5441 Lumbago with sciatica, right side: Secondary | ICD-10-CM | POA: Insufficient documentation

## 2016-01-04 DIAGNOSIS — I10 Essential (primary) hypertension: Secondary | ICD-10-CM | POA: Insufficient documentation

## 2016-01-04 DIAGNOSIS — X58XXXA Exposure to other specified factors, initial encounter: Secondary | ICD-10-CM | POA: Insufficient documentation

## 2016-01-04 DIAGNOSIS — Z79899 Other long term (current) drug therapy: Secondary | ICD-10-CM | POA: Insufficient documentation

## 2016-01-04 MED ORDER — AMLODIPINE BESYLATE 10 MG PO TABS: 10.0000 mg | ORAL_TABLET | Freq: Every day | ORAL | Status: DC

## 2016-01-04 MED ORDER — ACETAMINOPHEN-CODEINE #3 300-30 MG PO TABS: 1.0000 | ORAL_TABLET | Freq: Three times a day (TID) | ORAL | Status: DC | PRN

## 2016-01-04 MED ORDER — LIDOCAINE HCL 2 % EX CREA: 1.0000 "application " | TOPICAL_CREAM | CUTANEOUS | Status: DC | PRN

## 2016-01-04 MED ORDER — DICLOFENAC SODIUM 1 % TD GEL: 2.0000 g | Freq: Four times a day (QID) | TRANSDERMAL | Status: DC

## 2016-01-04 MED ORDER — METHYLPREDNISOLONE 4 MG PO TBPK: ORAL_TABLET | ORAL | Status: DC

## 2016-01-04 MED FILL — VOLTAREN 1% GEL: 1 | 12 days supply | Qty: 100 | Fill #0

## 2016-01-04 MED FILL — ACETAMINOPHEN/COD #3 TABLET: 300-30 | 30 days supply | Qty: 90 | Fill #0

## 2016-01-04 MED FILL — METHYLPREDNISOLONE 4 MG TAB: 4 | 7 days supply | Qty: 21 | Fill #0

## 2016-01-04 NOTE — Assessment & Plan Note (Addendum)
Lower back pain with radiation to R side is new, no red flags. Most likely pain is related to change in gait due to chronic R knee pain  Course of steroids Tramadol  Use of a cane would be helpful also home or outpatient PT if symptoms persist

## 2016-01-04 NOTE — Assessment & Plan Note (Signed)
Improved HTN Increase norvasc to 10 mg daily  Goal BP < 140/90

## 2016-01-04 NOTE — Assessment & Plan Note (Signed)
A: chronic R knee pain with small effusion P: Ortho referral for second opinion Continue gabapentin while waiting to transition to Lyrica Tramadol for pain control. Patient signed controlled substance contract

## 2016-01-04 NOTE — Addendum Note (Signed)
Addended by: Dessa PhiFUNCHES, Catriona Dillenbeck on: 01/04/2016 04:11 PM   Modules accepted: Kipp BroodSmartSet

## 2016-01-04 NOTE — Progress Notes (Signed)
F/U Knee pain and back pain  Referral orthopedic in [place]  Pain scale # 7 Tobacco user 1-2 per day someday none  No suicidal thoughts in the past two weeks

## 2016-01-04 NOTE — Patient Instructions (Addendum)
Peter Friedman was seen today for knee pain.  Diagnoses and all orders for this visit:  Degenerative tear of medial meniscus of right knee -     Ambulatory referral to Orthopedic Surgery -     acetaminophen-codeine (TYLENOL #3) 300-30 MG tablet; Take 1 tablet by mouth every 8 (eight) hours as needed for moderate pain.  Essential hypertension -     amLODipine (NORVASC) 10 MG tablet; Take 1 tablet (10 mg total) by mouth daily.  Bilateral low back pain with right-sided sciatica -     methylPREDNISolone (MEDROL DOSEPAK) 4 MG TBPK tablet; Taper per packet insert    F/u in 4 week for R knee pain, new onset back pain, and HTN  Dr. Armen PickupFunches

## 2016-01-04 NOTE — Addendum Note (Signed)
Addended by: Dessa PhiFUNCHES, [place] on: 01/04/2016 04:15 PM   Modules accepted: Orders, SmartSet

## 2016-01-04 NOTE — Progress Notes (Signed)
Subjective:  Patient ID: Peter Friedman, male    DOB: [birth year]  Age: 50 y.o. MRN: [id number]  CC: Knee Pain   HPI Peter Friedman presents for    1. R knee pain chronic: has known degenerative tear of medial meniscus of R knee. Still with burning sensation of R knee. He is taking gabapentin that [facility] Dr[facility] Randall AnPoehling. [facility] Dr. Donna BernardLeris, due to inconsistent UDS, narcotic pain medicine was not offered. A genicular nerve block was offered and patient is considering this. He was upset by his visit with pain management and overall prefers not to return (see OV notes in care everywhere). He currently takes aleve and gabapentin for pain. He is awaiting Lyrica through patient assistance. He was prescribed lidocaine with Voltaren gel which he has not yet filled.  Pain persist without improvement. He continues to wear a brace most of the time, he did not come today in a brace. He request a referral to another Orthopaedic surgeon for a second opinion.   2. Lower back pain: whole lower back pain for the past 3-4 weeks. Sharp and burning pain. Taking gabapentin without relief. Takes a Has numbness on bottom of R foot every now and then. No fecal or urinary incontinence. No injury.   3. HTN: taking norvasc 5 mg daily. Still smoking. Sweating less often. No HA, CP, SOB or swelling in legs.   Social History  Substance Use Topics  . Smoking status: Current Every Day Smoker -- 0.50 packs/day    Types: Cigarettes  . Smokeless tobacco: Never Used  . Alcohol Use: No    Outpatient Prescriptions Prior to Visit  Medication Sig Dispense Refill  . albuterol (PROVENTIL HFA;VENTOLIN HFA) 108 (90 Base) MCG/ACT inhaler Inhale 2 puffs into the lungs every 6 (six) hours as needed for wheezing or shortness of breath. 1 Inhaler 0  . amLODipine (NORVASC) 5 MG tablet Take 1 tablet (5 mg total) by mouth daily. 90 tablet 3  .  buPROPion (WELLBUTRIN XL) 150 MG 24 hr tablet Take 2 tablets (300 mg total) by mouth daily. 60 tablet 2  . gabapentin (NEURONTIN) 300 MG capsule Take 2 capsules (600 mg total) by mouth 3 (three) times daily. 180 capsule 2  . oxycodone (OXY-IR) 5 MG capsule Take 5 mg by mouth every 4 (four) hours as needed. Reported on 11/28/2015    . amoxicillin (AMOXIL) 500 MG capsule Take 1 capsule (500 mg total) by mouth 3 (three) times daily. 30 capsule 0   No facility-administered medications prior to visit.    ROS Review of Systems  Constitutional: Negative for fever, chills, fatigue and unexpected weight change.  HENT: Negative for dental problem.   Eyes: Negative for visual disturbance.  Respiratory: Negative for cough, shortness of breath and wheezing.   Cardiovascular: Negative for chest pain, palpitations and leg swelling.  Gastrointestinal: Negative for nausea, vomiting, abdominal pain, diarrhea, constipation and blood in stool.  Endocrine: Negative for polydipsia, polyphagia and polyuria.  Musculoskeletal: Positive for back pain, joint swelling, arthralgias and gait problem. Negative for myalgias and neck pain.  Skin: Negative for rash.  Allergic/Immunologic: Negative for immunocompromised state.  Hematological: Negative for adenopathy. Does not bruise/bleed easily.  Psychiatric/Behavioral: Negative for suicidal ideas, sleep disturbance and dysphoric mood. The patient is not nervous/anxious.     Objective:  BP 145/82 mmHg  Pulse 77  Temp(Src) 98.5 F (36.9 C) (Oral)  Resp 16  Ht  (1.753 m)  Wt 247 lb (112.038 kg)  BMI 36.46 kg/m2  SpO2 99%  BP/Weight 01/04/2016 11/28/2015 10/27/2015  Systolic BP 145 149 135  Diastolic BP 82 85 76  Wt. (Lbs) 247 251 250  BMI 36.46 37.05 36.9     Physical Exam  Constitutional: He appears well-developed and well-nourished. No distress.  HENT:  Head: Normocephalic and atraumatic.  Neck: Normal range of motion. Neck supple.  Cardiovascular:  Normal rate, regular rhythm, normal heart sounds and intact distal pulses.   Pulmonary/Chest: Effort normal and breath sounds normal.  Musculoskeletal: He exhibits no edema.       Right knee: He exhibits decreased range of motion, swelling and effusion. Tenderness found. Medial joint line tenderness noted.       Lumbar back: He exhibits decreased range of motion, tenderness, bony tenderness, laceration and pain. He exhibits no swelling, no edema, no deformity, no spasm and normal pulse.  Back Exam: Back: Normal Curvature, no deformities or CVA tenderness  Paraspinal Tenderness: b/l lumbar   LE Strength 5/5  LE Sensation: in tact  LE Reflexes 2+ and symmetric  Straight leg raise: pain down to R posterior knee with R leg raise    Neurological: He is alert.  Skin: Skin is warm and dry. No rash noted. No erythema.  Psychiatric: He has a normal mood and affect.     Assessment & Plan:   Peter Friedman was seen today for knee pain.  Diagnoses and all orders for this visit:  Degenerative tear of medial meniscus of right knee -     Ambulatory referral to Orthopedic Surgery -     acetaminophen-codeine (TYLENOL #3) 300-30 MG tablet; Take 1 tablet by mouth every 8 (eight) hours as needed for moderate pain.  Essential hypertension -     amLODipine (NORVASC) 10 MG tablet; Take 1 tablet (10 mg total) by mouth daily.  Bilateral low back pain with right-sided sciatica -     methylPREDNISolone (MEDROL DOSEPAK) 4 MG TBPK tablet; Taper per packet insert    Meds ordered this encounter  Medications  . acetaminophen-codeine (TYLENOL #3) 300-30 MG tablet    Sig: Take 1 tablet by mouth every 8 (eight) hours as needed for moderate pain.    Dispense:  90 tablet    Refill:  0    Follow-up: No Follow-up on file.   Dessa Phi MD

## 2016-01-05 MED ORDER — LIDOCAINE HCL 2 % EX GEL: 1.0000 "application " | CUTANEOUS | Status: DC | PRN

## 2016-01-05 NOTE — Addendum Note (Signed)
Addended by: Dessa PhiFUNCHES, Khilynn Borntreger on: 01/05/2016 10:06 AM   Modules accepted: Orders, SmartSet

## 2016-01-12 MED FILL — ?AMLODIPINE BESYLATE 10 MG: 10 | 30 days supply | Qty: 30 | Fill #0

## 2016-01-17 ENCOUNTER — Encounter: Payer: Self-pay | Admitting: Sports Medicine

## 2016-01-17 ENCOUNTER — Ambulatory Visit (INDEPENDENT_AMBULATORY_CARE_PROVIDER_SITE_OTHER): Payer: Self-pay | Admitting: Sports Medicine

## 2016-01-17 VITALS — BP 136/89 | Ht 69.0 in | Wt 245.0 lb

## 2016-01-17 DIAGNOSIS — M1711 Unilateral primary osteoarthritis, right knee: Secondary | ICD-10-CM

## 2016-01-17 DIAGNOSIS — M25561 Pain in right knee: Secondary | ICD-10-CM

## 2016-01-17 MED ORDER — NAPROXEN 500 MG PO TABS: 500.0000 mg | ORAL_TABLET | Freq: Two times a day (BID) | ORAL | Status: DC | PRN

## 2016-01-17 MED FILL — NAPROXEN 500 MG TABLET: 500 | 30 days supply | Qty: 60 | Fill #0

## 2016-01-17 NOTE — Progress Notes (Signed)
Subjective: CC: R knee pain HPI: Patient is a 50 y.o. male with a past medical history of right medial meniscal tear s/p repair presenting to clinic today for pain in the right knee.  He notes he's still having pain at the medial aspect of his right knee. On February 24, 2015,  he had surgery at [facility] with Dr. Randall AnPoehling to repair his meniscal tear. He notes nothing got better with the surgery. He still has pain at the medial aspect and swelling all the time. He feels like his knee is unstable and weaker. . He has been wearing a supportive brace (soudns like it has metal supports) which helps with stability.  He as a sleeve that he puts on and ices 3-4x/day He's taking tylenol #3s. He is taking gabapentin but this has not helped. He is going to be switched from gabapentin to Lyrica. He's had an injection previously in that knee prior to his surgery and he felt it only helped for approximately 1 week.   Social History: currently not working, was a Bankerprofessional painter.    ROS: All other systems reviewed and are negative.  Past Medical History Patient Active Problem List   Diagnosis Date Noted  . Bilateral low back pain with right-sided sciatica 01/04/2016  . HTN (hypertension) 11/28/2015  . Pain, dental 11/28/2015  . Prediabetes 10/27/2015  . Degenerative tear of medial meniscus of right knee 08/24/2014  . Tobacco abuse 08/24/2014    Medications- reviewed and updated Current Outpatient Prescriptions  Medication Sig Dispense Refill  . acetaminophen-codeine (TYLENOL #3) 300-30 MG tablet Take 1 tablet by mouth every 8 (eight) hours as needed for moderate pain. 90 tablet 0  . albuterol (PROVENTIL HFA;VENTOLIN HFA) 108 (90 Base) MCG/ACT inhaler Inhale 2 puffs into the lungs every 6 (six) hours as needed for wheezing or shortness of breath. 1 Inhaler 0  . amLODipine (NORVASC) 10 MG tablet Take 1 tablet (10 mg total) by mouth daily. 90 tablet 3  . amLODipine (NORVASC) 5 MG tablet   3  .  buPROPion (WELLBUTRIN XL) 150 MG 24 hr tablet Take 2 tablets (300 mg total) by mouth daily. 60 tablet 2  . diclofenac sodium (VOLTAREN) 1 % GEL Apply 2 g topically 4 (four) times daily. 100 g 5  . gabapentin (NEURONTIN) 300 MG capsule Take 2 capsules (600 mg total) by mouth 3 (three) times daily. 180 capsule 2  . lidocaine (XYLOCAINE) 2 % jelly Apply 1 application topically as needed. 30 mL 5  . methylPREDNISolone (MEDROL DOSEPAK) 4 MG TBPK tablet Taper per packet insert 21 tablet 0  . pregabalin (LYRICA) 100 MG capsule Take 100 mg by mouth. Reported on 01/04/2016     No current facility-administered medications for this visit.    Objective: Office vital signs reviewed. BP 136/89 mmHg  Ht 5\' 9"  (1.753 m)  Wt 245 lb (111.131 kg)  BMI 36.16 kg/m2   Physical Examination:  General: Awake, alert, well- nourished, NAD  Right knee: Well healed surgical scars and some swelling noted. Fullness in the popliteal fossa. Pain at the mediation joint line. No patellar or condyle tenderness.  No TTP along infrapatellar or pes anserine bursas.  ROM decreased in flexion (100 degrees), normal in extension (0 degrees) and lower leg rotation. Ligaments with solid consistent endpoints including ACL, PCL, LCL, MCL.   Negative Mcmurray's, some pain at the medial aspect of the knee with [facility]  Non painful patellar compression.   4+/5 strength in flexion and extension.  Patellar and quadriceps tendons unremarkable.    Assessment/Plan: Right knee pain: s/p medial meniscal tear with repair in July 2016. I suspect given exam findings and MRI results from 06/2015 this is secondary to degenerative changes. I suspect he didn't respond to initial joint injection due to meniscal tear confounding pain.  - X-rays of right knee ordered - Naproxen  BID PRN - Patient to follow up in 2 weeks, at that time may consider steroid injection pending response to Naproxen and imaging findings   - ultimately, pt may require  a knee replacement with referral to [facility].    Joanna Puff PGY-2, [facility]   Patient seen and evaluated with the resident. I agree with the above plan of care. I will get some updated x-rays to evaluate the degree of osteoarthritis seen. His operative note states that he had some grade 3 changes in the medial compartment. Since he does have a positive response to over-the-counter Aleve, I will try him on prescription strength naproxen sodium 500 mg twice daily as needed. He will take this with food and is cautioned about stomach upset. He will follow-up with me in 2 weeks to discuss his x-rays and, depending on his response to the naproxen sodium, we may consider a cortisone injection at that time. Definitive treatment is a total knee arthroplasty but the patient is really too young to consider that at this time.

## 2016-01-19 ENCOUNTER — Ambulatory Visit
Admission: RE | Admit: 2016-01-19 | Discharge: 2016-01-19 | Disposition: A | Discharge status: Home / Self Care | Payer: No Typology Code available for payment source | Source: Ambulatory Visit | Attending: Sports Medicine | Admitting: Sports Medicine

## 2016-01-19 DIAGNOSIS — M25561 Pain in right knee: Secondary | ICD-10-CM

## 2016-01-31 ENCOUNTER — Ambulatory Visit (INDEPENDENT_AMBULATORY_CARE_PROVIDER_SITE_OTHER): Payer: Self-pay | Admitting: Sports Medicine

## 2016-01-31 ENCOUNTER — Encounter: Payer: Self-pay | Admitting: Sports Medicine

## 2016-01-31 VITALS — BP 133/87 | HR 72 | Ht 69.0 in | Wt 245.0 lb

## 2016-01-31 DIAGNOSIS — M25561 Pain in right knee: Secondary | ICD-10-CM

## 2016-01-31 NOTE — Progress Notes (Signed)
   Subjective:    Patient ID: Peter Friedman, male    DOB: 10-25-1965, 50 y.o.   MRN: [id number]  HPI   Patient comes in today for follow-up on right knee pain. Pain is about the same. He has not really noticed any benefit from naproxen sodium. Recent x-rays of the right knee showed some mild medial compartmental narrowing. In addition the pain he is also continuing to get swelling and occasional locking and catching in the knee. He is now starting to get back pain because of his limp.    Review of Systems     Objective:   Physical Exam  Well-developed, well-nourished. No acute distress  Right knee: Full range of motion. No obvious effusion. He is tender to palpation along the medial and lateral joint lines. Pain but no popping with McMurray's. Knee is grossly stable to ligamentous exam. There is some weakness of his quad muscles noted when actively flexing the knee against gravity. Neurovascularly intact distally. Walking with a limp.  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 Although his x-ray does show some mild medial compartmental osteoarthritis I question whether or not he is still getting some symptoms from his meniscus. Therefore, I will get an updated MRI of the right knee to evaluate further. Phone follow-up after I reviewed that study. We will delineate further treatment based on those findings. We discussed the possibility of intra-articular cortisone injection or referral to [facility]. In the meantime, I've given him a set of crutches to help assist him with ambulation.

## 2016-02-01 ENCOUNTER — Ambulatory Visit: Payer: No Typology Code available for payment source | Attending: Family Medicine | Admitting: Family Medicine

## 2016-02-01 ENCOUNTER — Encounter: Payer: Self-pay | Admitting: Family Medicine

## 2016-02-01 VITALS — BP 123/82 | HR 75 | Temp 98.4°F | Resp 16 | Ht 69.0 in | Wt 246.0 lb

## 2016-02-01 DIAGNOSIS — S83241A Other tear of medial meniscus, current injury, right knee, initial encounter: Secondary | ICD-10-CM | POA: Insufficient documentation

## 2016-02-01 DIAGNOSIS — J302 Other seasonal allergic rhinitis: Secondary | ICD-10-CM | POA: Insufficient documentation

## 2016-02-01 DIAGNOSIS — M23203 Derangement of unspecified medial meniscus due to old tear or injury, right knee: Secondary | ICD-10-CM

## 2016-02-01 DIAGNOSIS — F17210 Nicotine dependence, cigarettes, uncomplicated: Secondary | ICD-10-CM | POA: Insufficient documentation

## 2016-02-01 DIAGNOSIS — M25561 Pain in right knee: Secondary | ICD-10-CM | POA: Insufficient documentation

## 2016-02-01 DIAGNOSIS — M23231 Derangement of other medial meniscus due to old tear or injury, right knee: Secondary | ICD-10-CM

## 2016-02-01 DIAGNOSIS — M5441 Lumbago with sciatica, right side: Secondary | ICD-10-CM | POA: Insufficient documentation

## 2016-02-01 DIAGNOSIS — I10 Essential (primary) hypertension: Secondary | ICD-10-CM | POA: Insufficient documentation

## 2016-02-01 DIAGNOSIS — Y33XXXA Other specified events, undetermined intent, initial encounter: Secondary | ICD-10-CM | POA: Insufficient documentation

## 2016-02-01 MED ORDER — PREDNISONE 10 MG PO TABS
10.0000 mg | ORAL_TABLET | Freq: Every day | ORAL | Status: DC
Start: 2016-02-01 — End: 2016-04-05

## 2016-02-01 MED ORDER — ACETAMINOPHEN-CODEINE #3 300-30 MG PO TABS: 1.0000 | ORAL_TABLET | Freq: Three times a day (TID) | ORAL | Status: DC | PRN

## 2016-02-01 MED ORDER — CETIRIZINE HCL 10 MG PO TABS: 10.0000 mg | ORAL_TABLET | Freq: Every day | ORAL | Status: DC

## 2016-02-01 MED FILL — ACETAMINOPHEN/COD #3 TABLET: 300-30 | 30 days supply | Qty: 90 | Fill #0

## 2016-02-01 MED FILL — ?CETIRIZINE HCL 10 MG TABLE: 10 | 30 days supply | Qty: 30 | Fill #0

## 2016-02-01 MED FILL — predniSONE 10 MG TABS: 10 | 7 days supply | Qty: 18 | Fill #0

## 2016-02-01 NOTE — Assessment & Plan Note (Signed)
Pain controlled Refilled tylenol #3 F.u with sports medicine  MRI pending

## 2016-02-01 NOTE — Assessment & Plan Note (Signed)
Well-controlled.  Continue current regimen. 

## 2016-02-01 NOTE — Assessment & Plan Note (Addendum)
Persistent pain [place]reat with prednisone taper  Temporarily stop naproxen Continue gabapentin   Phone call f/u back pain after prednisone taper

## 2016-02-01 NOTE — Assessment & Plan Note (Signed)
Seasonal allergies suspected source of sore throat Zyrtec ordered

## 2016-02-01 NOTE — Progress Notes (Signed)
Subjective:  Patient ID: Peter Friedman, male    DOB: 05-15-1966  Age: 50 y.o. MRN: [id number]  CC: No chief complaint on file.   HPI Peter Friedman presents for    1. R knee pain chronic: has known degenerative tear of medial meniscus of R knee. Still with burning sensation of R knee. He is taking gabapentin that was prescribed by her orthopaedic surgeon at [facility], Dr. Randall An. He has been referred to pain management at [facility], Dr. Donna Bernard, due to inconsistent UDS, narcotic pain medicine was not offered. A genicular nerve block was offered and patient is considering this. He was upset by his visit with pain management and overall prefers not to return (see OV notes in care everywhere). He currently takes aleve and gabapentin for pain. He is awaiting Lyrica through patient assistance. He has re-established with sports medicine who has recommended repeat MRI to re-evaluate meniscus. He is taking tylenol #3 which controls his pain. He is also taking gabapentin.   2. Lower back pain: whole lower back pain for the past 2 months that is worse on the right. Sharp and burning pain. Taking gabapentin without relief. Takes a Has numbness on bottom of R foot every now and then. No fecal or urinary incontinence. No injury. Pain has become more persistent. He took the methylprednisolone pak last month with no significant improvement. His gait has been affected by his R knee pain.   3. HTN: taking norvasc 5 mg daily. Still smoking. No HA, CP, SOB or swelling in legs.   4. Sore throat: x 4 days. Itchy and scratchy throat. No fever or chills. No cough. No sick contacts. He denies acid reflux.   Social History  Substance Use Topics  . Smoking status: Current Every Day Smoker -- 0.50 packs/day    Types: Cigarettes  . Smokeless tobacco: Never Used  . Alcohol Use: No    Outpatient Prescriptions Prior to Visit  Medication Sig Dispense Refill  . acetaminophen-codeine (TYLENOL #3) 300-30 MG tablet Take  1 tablet by mouth every 8 (eight) hours as needed for moderate pain. 90 tablet 0  . albuterol (PROVENTIL HFA;VENTOLIN HFA) 108 (90 Base) MCG/ACT inhaler Inhale 2 puffs into the lungs every 6 (six) hours as needed for wheezing or shortness of breath. 1 Inhaler 0  . amLODipine (NORVASC) 10 MG tablet Take 1 tablet (10 mg total) by mouth daily. 90 tablet 3  . amLODipine (NORVASC) 5 MG tablet   3  . buPROPion (WELLBUTRIN XL) 150 MG 24 hr tablet Take 2 tablets (300 mg total) by mouth daily. 60 tablet 2  . diclofenac sodium (VOLTAREN) 1 % GEL Apply 2 g topically 4 (four) times daily. 100 g 5  . gabapentin (NEURONTIN) 300 MG capsule Take 2 capsules (600 mg total) by mouth 3 (three) times daily. 180 capsule 2  . lidocaine (XYLOCAINE) 2 % jelly Apply 1 application topically as needed. 30 mL 5  . methylPREDNISolone (MEDROL DOSEPAK) 4 MG TBPK tablet Taper per packet insert 21 tablet 0  . naproxen (NAPROSYN) 500 MG tablet Take 1 tablet (500 mg total) by mouth 2 (two) times daily as needed. 60 tablet 1  . pregabalin (LYRICA) 100 MG capsule Take 100 mg by mouth. Reported on 01/04/2016     No facility-administered medications prior to visit.    ROS Review of Systems  Constitutional: Negative for fever, chills, fatigue and unexpected weight change.  HENT: Positive for sore throat. Negative for dental problem.   Eyes:  Negative for visual disturbance.  Respiratory: Negative for cough, shortness of breath and wheezing.   Cardiovascular: Negative for chest pain, palpitations and leg swelling.  Gastrointestinal: Negative for nausea, vomiting, abdominal pain, diarrhea, constipation and blood in stool.  Endocrine: Negative for polydipsia, polyphagia and polyuria.  Musculoskeletal: Positive for back pain, joint swelling, arthralgias and gait problem. Negative for myalgias and neck pain.  Skin: Negative for rash.  Allergic/Immunologic: Negative for immunocompromised state.  Hematological: Negative for adenopathy.  Does not bruise/bleed easily.  Psychiatric/Behavioral: Negative for suicidal ideas, sleep disturbance and dysphoric mood. The patient is not nervous/anxious.     Objective:  BP 123/82 mmHg  Pulse 75  Temp(Src) 98.4 F (36.9 C) (Oral)  Resp 16  Ht 5\' 9"  (1.753 m)  Wt 246 lb (111.585 kg)  BMI 36.31 kg/m2  SpO2 98%  BP/Weight 02/01/2016 01/31/2016 01/17/2016  Systolic BP 123 133 136  Diastolic BP 82 87 89  Wt. (Lbs) 246 245 245  BMI 36.31 36.16 36.16     Physical Exam  Constitutional: He appears well-developed and well-nourished. No distress.  HENT:  Head: Normocephalic and atraumatic.  Mouth/Throat: Uvula is midline, oropharynx is clear and moist and mucous membranes are normal.  Neck: Normal range of motion. Neck supple. No spinous process tenderness and no muscular tenderness present. No edema and no erythema present.  Cardiovascular: Normal rate, regular rhythm, normal heart sounds and intact distal pulses.   Pulmonary/Chest: Effort normal and breath sounds normal.  Musculoskeletal: He exhibits no edema.       Right knee: He exhibits decreased range of motion, swelling and effusion. Tenderness found. Medial joint line tenderness noted.       Lumbar back: He exhibits decreased range of motion, tenderness, bony tenderness, laceration and pain. He exhibits no swelling, no edema, no deformity, no spasm and normal pulse.  No cane today   Lymphadenopathy:    He has no cervical adenopathy.  Neurological: He is alert.  Skin: Skin is warm and dry. No rash noted. No erythema.  Psychiatric: He has a normal mood and affect.     Assessment & Plan:   Doran was seen today for hypertension and knee pain.  Diagnoses and all orders for this visit:  Degenerative tear of medial meniscus of right knee -     acetaminophen-codeine (TYLENOL #3) 300-30 MG tablet; Take 1 tablet by mouth every 8 (eight) hours as needed for moderate pain.  Bilateral low back pain with right-sided sciatica -      predniSONE (DELTASONE) 10 MG tablet; Take 1 tablet (10 mg total) by mouth daily with breakfast. Take by mouth 40 mg day 1, 40 mg day 2, 30 mg day 3, 30 mg day 4, 20 mg day 5, 10 mg day 6, 10 mg day 7  Seasonal allergies -     cetirizine (ZYRTEC) 10 MG tablet; Take 1 tablet (10 mg total) by mouth daily.   Meds ordered this encounter  Medications  . acetaminophen-codeine (TYLENOL #3) 300-30 MG tablet    Sig: Take 1 tablet by mouth every 8 (eight) hours as needed for moderate pain.    Dispense:  90 tablet    Refill:  0  . cetirizine (ZYRTEC) 10 MG tablet    Sig: Take 1 tablet (10 mg total) by mouth daily.    Dispense:  30 tablet    Refill:  11  . predniSONE (DELTASONE) 10 MG tablet    Sig: Take 1 tablet (10 mg total) by mouth daily  with breakfast. Take by mouth 40 mg day 1, 40 mg day 2, 30 mg day 3, 30 mg day 4, 20 mg day 5, 10 mg day 6, 10 mg day 7    Dispense:  18 tablet    Refill:  0    Follow-up: No Follow-up on file.   Dessa Phi MD

## 2016-02-01 NOTE — Patient Instructions (Addendum)
Peter Friedman was seen today for hypertension and knee pain.  Diagnoses and all orders for this visit:  Degenerative tear of medial meniscus of right knee -     acetaminophen-codeine (TYLENOL #3) 300-30 MG tablet; Take 1 tablet by mouth every 8 (eight) hours as needed for moderate pain.  Bilateral low back pain with right-sided sciatica -     predniSONE (DELTASONE) 10 MG tablet; Take 1 tablet (10 mg total) by mouth daily with breakfast. Take by mouth 40 mg day 1, 40 mg day 2, 30 mg day 3, 30 mg day 4, 20 mg day 5, 10 mg day 6, 10 mg day 7  Seasonal allergies -     cetirizine (ZYRTEC) 10 MG tablet; Take 1 tablet (10 mg total) by mouth daily.  stop naproxen for now and take prednisone with food  call with an update on your back pain after taking the prednisone   F/u in 2 months for HTN   Dr. Armen PickupFunches

## 2016-02-01 NOTE — Progress Notes (Signed)
F/U HTN  Taking medication as prescribed  Pain scale #6 Rt knee and back pain  Tobacco user 1-2 per day day  No suicidal thoughts in the past two weeks

## 2016-02-07 MED FILL — AMLODIPINE BESYLATE 10 MG T: 10 | 90 days supply | Qty: 90 | Fill #1

## 2016-02-12 ENCOUNTER — Ambulatory Visit
Admission: RE | Admit: 2016-02-12 | Discharge: 2016-02-12 | Disposition: A | Discharge status: Home / Self Care | Payer: No Typology Code available for payment source | Source: Ambulatory Visit | Attending: Sports Medicine | Admitting: Sports Medicine

## 2016-02-12 DIAGNOSIS — M25561 Pain in right knee: Secondary | ICD-10-CM

## 2016-02-23 ENCOUNTER — Telehealth: Payer: Self-pay | Admitting: Sports Medicine

## 2016-02-23 NOTE — Telephone Encounter (Signed)
I spoke with the patient on the phone today after reviewing the MRI of his right knee. He has changes consistent with a prior meniscectomy. No evidence of meniscal repair. He also has some partial thickness cartilage loss of the medial femoral condyle and medial tibial plateau with subchondral reactive marrow edema in the medial tibial plateau. I believe that this is his source of pain and based on this finding I recommend that we try a cortisone injection along with an isometric quad strengthening program and bracing. If he continues to struggle after the injection then we could consider surgical referral for possible chondroplasty. Patient will follow-up with me in one to 2 weeks to discuss these options in greater detail.

## 2016-03-04 ENCOUNTER — Ambulatory Visit (INDEPENDENT_AMBULATORY_CARE_PROVIDER_SITE_OTHER): Payer: Self-pay | Admitting: Sports Medicine

## 2016-03-04 ENCOUNTER — Encounter: Payer: Self-pay | Admitting: Sports Medicine

## 2016-03-04 ENCOUNTER — Telehealth: Payer: Self-pay | Admitting: Family Medicine

## 2016-03-04 VITALS — BP 122/77 | HR 81 | Ht 69.0 in | Wt 245.0 lb

## 2016-03-04 DIAGNOSIS — M1711 Unilateral primary osteoarthritis, right knee: Secondary | ICD-10-CM

## 2016-03-04 DIAGNOSIS — M23203 Derangement of unspecified medial meniscus due to old tear or injury, right knee: Secondary | ICD-10-CM

## 2016-03-04 DIAGNOSIS — M25561 Pain in right knee: Secondary | ICD-10-CM

## 2016-03-04 MED ORDER — METHYLPREDNISOLONE ACETATE 40 MG/ML IJ SUSP
40.0000 mg | Freq: Once | INTRAMUSCULAR | Status: AC
  Administered 2016-03-04: 40 mg via INTRA_ARTICULAR

## 2016-03-04 MED ORDER — ACETAMINOPHEN-CODEINE #3 300-30 MG PO TABS: 1.0000 | ORAL_TABLET | Freq: Three times a day (TID) | ORAL | Status: DC | PRN

## 2016-03-04 MED FILL — ACETAMINOPHEN/COD #3 TABLET: 300-30 | 30 days supply | Qty: 90 | Fill #0

## 2016-03-04 NOTE — Progress Notes (Signed)
Patient ID: Peter Friedman, male   DOB: [birth year], 50 y.o.   MRN: [id number]  Patient comes in today for a cortisone injection into his right knee. MRI of this right knee shows changes consistent with a prior meniscectomy as well as some partial thickness cartilage loss of the medial femoral condyle and medial tibial plateau with subchondral reactive marrow edema in the medial tibial plateau. Cortisone injection was administered. Patient tolerated this without difficulty. We also gave him a knee brace and he will follow-up in 4 weeks. If symptoms persist then we could consider referral for possible chondroplasty. He also has Voltaren gel and naproxen sodium that he can take as needed for pain.  Consent obtained and verified. Time-out conducted. Noted no overlying erythema, induration, or other signs of local infection. Skin prepped in a sterile fashion. Topical analgesic spray: Ethyl chloride. Joint: right knee Needle: 22g 1.5 inch Completed without difficulty. Meds: 3cc 1% xylocaine, 1cc (40mg ) depomedrol  Advised to call if fevers/chills, erythema, induration, drainage, or persistent bleeding.

## 2016-03-04 NOTE — Telephone Encounter (Signed)
Will forward to pcp

## 2016-03-04 NOTE — Telephone Encounter (Signed)
Patient called and is requesting tylenol 3. Please follow up.

## 2016-03-04 NOTE — Addendum Note (Signed)
Addended byFrankey Poot: Graham Hyun N on: 03/04/2016 02:25 PM   Modules accepted: Orders

## 2016-03-04 NOTE — Telephone Encounter (Signed)
Contacted pt to inform him his rx for tylenol 3 is ready for pick up

## 2016-03-04 NOTE — Telephone Encounter (Signed)
Tylenol #3 refilled  

## 2016-03-25 ENCOUNTER — Telehealth: Payer: Self-pay | Admitting: Family Medicine

## 2016-03-25 NOTE — Telephone Encounter (Signed)
Patient stated that they've been experiencing pain in the left hill. Patient would like to be referred to see a foot doctor. Please follow

## 2016-03-26 NOTE — Telephone Encounter (Signed)
Since this is a new problem, patient will need OV to evaluate foot pain prior to referral

## 2016-04-02 ENCOUNTER — Ambulatory Visit (INDEPENDENT_AMBULATORY_CARE_PROVIDER_SITE_OTHER): Payer: Self-pay | Admitting: Sports Medicine

## 2016-04-02 ENCOUNTER — Encounter: Payer: Self-pay | Admitting: Sports Medicine

## 2016-04-02 VITALS — BP 117/96 | HR 74 | Ht 69.0 in | Wt 245.0 lb

## 2016-04-02 DIAGNOSIS — M25561 Pain in right knee: Secondary | ICD-10-CM

## 2016-04-02 DIAGNOSIS — M1711 Unilateral primary osteoarthritis, right knee: Secondary | ICD-10-CM

## 2016-04-02 MED ORDER — TRAMADOL HCL 50 MG PO TABS: 50.0000 mg | ORAL_TABLET | Freq: Two times a day (BID) | ORAL | 0 refills | Status: DC | PRN

## 2016-04-02 MED ORDER — NAPROXEN 500 MG PO TABS: 500.0000 mg | ORAL_TABLET | Freq: Two times a day (BID) | ORAL | 1 refills | Status: DC

## 2016-04-02 NOTE — Progress Notes (Signed)
   Subjective:    Patient ID: Peter Friedman, male    DOB: July 17, 1966, 50 y.o.   MRN: [id number]  HPI  Patient comes in today for follow-up on right knee pain. Recent MRI of the right knee shows some partial-thickness cartilage loss of the medial femoral condyle and medial tibial plateau with subchondral reactive marrow edema in the medial tibial plateau. I injected his knee with cortisone on July 17 and this helped for about 3 weeks. His pain has now returned. It is primarily along the medial aspect of his knee. In addition to pain he is also getting some instability and weakness which has been long-standing. He gets intermittent swelling. No mechanical symptoms. Double upright brace does help with instability when he wears it. Over-the-counter Aleve also helps.    Review of Systems     Objective:   Physical Exam  Well-developed, well-nourished. Acute distress. Sitting comfortably in exam room  Right knee: Full range of motion. Trace effusion. He is tender to palpation along the medial joint line with pain but no popping with McMurray's. No tenderness along the lateral joint line. Good ligamentous stability. Mild to moderate quadriceps weakness. Neurovascularly intact distally. Walking with a limp.      Assessment & Plan:   Right knee pain secondary to DJD Status post right knee meniscectomy, July 2016  I discussed the patient's options with him including physical therapy, oral medications, Visco supplementation, and referral for surgical options. He would like to start with some physical therapy. I will also give him a prescription for Naprosyn 500 mg to take twice daily with food as needed for pain. I will also give him a prescription for tramadol 50 mg to take twice a day as needed for more severe pain. Patient will follow-up with me again in 4 weeks for reevaluation. Visco supplementation and surgery are viable options if he continues to have severe pain.

## 2016-04-03 ENCOUNTER — Ambulatory Visit: Payer: No Typology Code available for payment source | Admitting: Sports Medicine

## 2016-04-05 ENCOUNTER — Encounter: Payer: Self-pay | Admitting: Family Medicine

## 2016-04-05 ENCOUNTER — Ambulatory Visit: Payer: Self-pay | Attending: Family Medicine | Admitting: Family Medicine

## 2016-04-05 VITALS — BP 140/82 | HR 66 | Temp 97.8°F | Ht 69.0 in | Wt 242.6 lb

## 2016-04-05 DIAGNOSIS — S83241A Other tear of medial meniscus, current injury, right knee, initial encounter: Secondary | ICD-10-CM | POA: Insufficient documentation

## 2016-04-05 DIAGNOSIS — F17210 Nicotine dependence, cigarettes, uncomplicated: Secondary | ICD-10-CM | POA: Insufficient documentation

## 2016-04-05 DIAGNOSIS — M23203 Derangement of unspecified medial meniscus due to old tear or injury, right knee: Secondary | ICD-10-CM

## 2016-04-05 DIAGNOSIS — Z79899 Other long term (current) drug therapy: Secondary | ICD-10-CM | POA: Insufficient documentation

## 2016-04-05 DIAGNOSIS — M722 Plantar fascial fibromatosis: Secondary | ICD-10-CM | POA: Insufficient documentation

## 2016-04-05 DIAGNOSIS — I10 Essential (primary) hypertension: Secondary | ICD-10-CM | POA: Insufficient documentation

## 2016-04-05 DIAGNOSIS — M23231 Derangement of other medial meniscus due to old tear or injury, right knee: Secondary | ICD-10-CM

## 2016-04-05 DIAGNOSIS — X58XXXA Exposure to other specified factors, initial encounter: Secondary | ICD-10-CM | POA: Insufficient documentation

## 2016-04-05 MED ORDER — ACETAMINOPHEN-CODEINE #3 300-30 MG PO TABS: 1.0000 | ORAL_TABLET | Freq: Three times a day (TID) | ORAL | 2 refills | Status: DC | PRN

## 2016-04-05 MED ORDER — NAPROXEN 500 MG PO TABS
500.0000 mg | ORAL_TABLET | Freq: Two times a day (BID) | ORAL | 1 refills | Status: DC
Start: 2016-04-05 — End: 2016-08-01

## 2016-04-05 MED FILL — NAPROXEN 500 MG TABLET: 500 | 30 days supply | Qty: 60 | Fill #0

## 2016-04-05 MED FILL — ACETAMINOPHEN/COD #3 TABLET: 300-30 | 30 days supply | Qty: 90 | Fill #0

## 2016-04-05 MED FILL — traMADol HCL 50 MG TABS: 50 | 30 days supply | Qty: 60 | Fill #0

## 2016-04-05 NOTE — Assessment & Plan Note (Signed)
BP elevated today but not above goal Continue current regimen Monitor at f/u

## 2016-04-05 NOTE — Assessment & Plan Note (Signed)
A: acute plantar fasciitis P: NSAID Ice Home PT Podiatry referral with plan to cancel appt of pain improves

## 2016-04-05 NOTE — Progress Notes (Signed)
C/C: patient states bottom of left heel is hurting, patient want to inquire about a referral.

## 2016-04-05 NOTE — Patient Instructions (Addendum)
Peter Friedman was seen today for hypertension and foot pain.  Diagnoses and all orders for this visit:  Plantar fasciitis, left -     naproxen (NAPROSYN) 500 MG tablet; Take 1 tablet (500 mg total) by mouth 2 (two) times daily with a meal. -     Ambulatory referral to Podiatry  Degenerative tear of medial meniscus of right knee -     acetaminophen-codeine (TYLENOL #3) 300-30 MG tablet; Take 1 tablet by mouth every 8 (eight) hours as needed for moderate pain.  ice foot for 20 minutes 2-3 times day in ice bath or use ice pack or roll foot on frozen water in plastic bottle Be careful with salt in diet. Eat low salt and work on weight loss for HTN.  F/u in 1 month for flu shot and HTN   Dr. Armen PickupFunches     Plantar Fasciitis With Rehab The plantar fascia is a fibrous, ligament-like, soft-tissue structure that spans the bottom of the foot. Plantar fasciitis, also called heel spur syndrome, is a condition that causes pain in the foot due to inflammation of the tissue. SYMPTOMS   Pain and tenderness on the underneath side of the foot.  Pain that worsens with standing or walking. CAUSES  Plantar fasciitis is caused by irritation and injury to the plantar fascia on the underneath side of the foot. Common mechanisms of injury include:  Direct trauma to bottom of the foot.  Damage to a small nerve that runs under the foot where the main fascia attaches to the heel bone.  Stress placed on the plantar fascia due to bone spurs. RISK INCREASES WITH:   Activities that place stress on the plantar fascia (running, jumping, pivoting, or cutting).  Poor strength and flexibility.  Improperly fitted shoes.  Tight calf muscles.  Flat feet.  Failure to warm-up properly before activity.  Obesity. PREVENTION  Warm up and stretch properly before activity.  Allow for adequate recovery between workouts.  Maintain physical fitness:  Strength, flexibility, and endurance.  Cardiovascular  fitness.  Maintain a health body weight.  Avoid stress on the plantar fascia.  Wear properly fitted shoes, including arch supports for individuals who have flat feet. PROGNOSIS  If treated properly, then the symptoms of plantar fasciitis usually resolve without surgery. However, occasionally surgery is necessary. RELATED COMPLICATIONS   Recurrent symptoms that may result in a chronic condition.  Problems of the lower back that are caused by compensating for the injury, such as limping.  Pain or weakness of the foot during push-off following surgery.  Chronic inflammation, scarring, and partial or complete fascia tear, occurring more often from repeated injections. TREATMENT  Treatment initially involves the use of ice and medication to help reduce pain and inflammation. The use of strengthening and stretching exercises may help reduce pain with activity, especially stretches of the Achilles tendon. These exercises may be performed at home or with a therapist. Your caregiver may recommend that you use heel cups of arch supports to help reduce stress on the plantar fascia. Occasionally, corticosteroid injections are given to reduce inflammation. If symptoms persist for greater than 6 months despite non-surgical (conservative), then surgery may be recommended.  MEDICATION   If pain medication is necessary, then nonsteroidal anti-inflammatory medications, such as aspirin and ibuprofen, or other minor pain relievers, such as acetaminophen, are often recommended.  Do not take pain medication within 7 days before surgery.  Prescription pain relievers may be given if deemed necessary by your caregiver. Use only as directed  and only as much as you need.  Corticosteroid injections may be given by your caregiver. These injections should be reserved for the most serious cases, because they may only be given a certain number of times. HEAT AND COLD  Cold treatment (icing) relieves pain and reduces  inflammation. Cold treatment should be applied for 10 to 15 minutes every 2 to 3 hours for inflammation and pain and immediately after any activity that aggravates your symptoms. Use ice packs or massage the area with a piece of ice (ice massage).  Heat treatment may be used prior to performing the stretching and strengthening activities prescribed by your caregiver, physical therapist, or athletic trainer. Use a heat pack or soak the injury in warm water. SEEK IMMEDIATE MEDICAL CARE IF:  Treatment seems to offer no benefit, or the condition worsens.  Any medications produce adverse side effects. EXERCISES RANGE OF MOTION (ROM) AND STRETCHING EXERCISES - Plantar Fasciitis (Heel Spur Syndrome) These exercises may help you when beginning to rehabilitate your injury. Your symptoms may resolve with or without further involvement from your physician, physical therapist or athletic trainer. While completing these exercises, remember:   Restoring tissue flexibility helps normal motion to return to the joints. This allows healthier, less painful movement and activity.  An effective stretch should be held for at least 30 seconds.  A stretch should never be painful. You should only feel a gentle lengthening or release in the stretched tissue. RANGE OF MOTION - Toe Extension, Flexion  Sit with your right / left leg crossed over your opposite knee.  Grasp your toes and gently pull them back toward the top of your foot. You should feel a stretch on the bottom of your toes and/or foot.  Hold this stretch for __________ seconds.  Now, gently pull your toes toward the bottom of your foot. You should feel a stretch on the top of your toes and or foot.  Hold this stretch for __________ seconds. Repeat __________ times. Complete this stretch __________ times per day.  RANGE OF MOTION - Ankle Dorsiflexion, Active Assisted  Remove shoes and sit on a chair that is preferably not on a carpeted  surface.  Place right / left foot under knee. Extend your opposite leg for support.  Keeping your heel down, slide your right / left foot back toward the chair until you feel a stretch at your ankle or calf. If you do not feel a stretch, slide your bottom forward to the edge of the chair, while still keeping your heel down.  Hold this stretch for __________ seconds. Repeat __________ times. Complete this stretch __________ times per day.  STRETCH - Gastroc, Standing  Place hands on wall.  Extend right / left leg, keeping the front knee somewhat bent.  Slightly point your toes inward on your back foot.  Keeping your right / left heel on the floor and your knee straight, shift your weight toward the wall, not allowing your back to arch.  You should feel a gentle stretch in the right / left calf. Hold this position for __________ seconds. Repeat __________ times. Complete this stretch __________ times per day. STRETCH - Soleus, Standing  Place hands on wall.  Extend right / left leg, keeping the other knee somewhat bent.  Slightly point your toes inward on your back foot.  Keep your right / left heel on the floor, bend your back knee, and slightly shift your weight over the back leg so that you feel a gentle stretch  deep in your back calf.  Hold this position for __________ seconds. Repeat __________ times. Complete this stretch __________ times per day. STRETCH - Gastrocsoleus, Standing  Note: This exercise can place a lot of stress on your foot and ankle. Please complete this exercise only if specifically instructed by your caregiver.   Place the ball of your right / left foot on a step, keeping your other foot firmly on the same step.  Hold on to the wall or a rail for balance.  Slowly lift your other foot, allowing your body weight to press your heel down over the edge of the step.  You should feel a stretch in your right / left calf.  Hold this position for __________  seconds.  Repeat this exercise with a slight bend in your right / left knee. Repeat __________ times. Complete this stretch __________ times per day.  STRENGTHENING EXERCISES - Plantar Fasciitis (Heel Spur Syndrome)  These exercises may help you when beginning to rehabilitate your injury. They may resolve your symptoms with or without further involvement from your physician, physical therapist or athletic trainer. While completing these exercises, remember:   Muscles can gain both the endurance and the strength needed for everyday activities through controlled exercises.  Complete these exercises as instructed by your physician, physical therapist or athletic trainer. Progress the resistance and repetitions only as guided. STRENGTH - Towel Curls  Sit in a chair positioned on a non-carpeted surface.  Place your foot on a towel, keeping your heel on the floor.  Pull the towel toward your heel by only curling your toes. Keep your heel on the floor.  If instructed by your physician, physical therapist or athletic trainer, add ____________________ at the end of the towel. Repeat __________ times. Complete this exercise __________ times per day. STRENGTH - Ankle Inversion  Secure one end of a rubber exercise band/tubing to a fixed object (table, pole). Loop the other end around your foot just before your toes.  Place your fists between your knees. This will focus your strengthening at your ankle.  Slowly, pull your big toe up and in, making sure the band/tubing is positioned to resist the entire motion.  Hold this position for __________ seconds.  Have your muscles resist the band/tubing as it slowly pulls your foot back to the starting position. Repeat __________ times. Complete this exercises __________ times per day.    This information is not intended to replace advice given to you by your health care provider. Make sure you discuss any questions you have with your health care  provider.   Document Released: 08/05/2005 Document Revised: 12/20/2014 Document Reviewed: 11/17/2008 Elsevier Interactive Patient Education [employer]2016 [employer]

## 2016-04-05 NOTE — Progress Notes (Signed)
Subjective:  Patient ID: Peter Friedman, male    DOB: 01/14/1966  Age: 50 y.o. MRN: [id number]  CC: Hypertension and Foot Pain   HPI Eason L Denny presents for    1. HTN: taking amlodipine 10 mg daily. Not compliant with low salt diet. Had coffee, ham and egg sandwich today. No HA, CP, SOB, leg swelling or vision changes.   2. Heel pain: L sided, about 9-10 days. Pain in L posterior heel. No injury. No redness or swelling. Pain is worse when standing from sitting or laying down. He is take taking naproxen. He completed prednisone course 2 months ago for flare up of his back pain.   HM: declines flu vaccine, will get it next month.    Social History  Substance Use Topics  . Smoking status: Light Tobacco Smoker    Packs/day: 0.50    Types: Cigarettes  . Smokeless tobacco: Never Used  . Alcohol use No    Outpatient Medications Prior to Visit  Medication Sig Dispense Refill  . acetaminophen-codeine (TYLENOL #3) 300-30 MG tablet Take 1 tablet by mouth every 8 (eight) hours as needed for moderate pain. 90 tablet 0  . albuterol (PROVENTIL HFA;VENTOLIN HFA) 108 (90 Base) MCG/ACT inhaler Inhale 2 puffs into the lungs every 6 (six) hours as needed for wheezing or shortness of breath. 1 Inhaler 0  . amLODipine (NORVASC) 10 MG tablet Take 1 tablet (10 mg total) by mouth daily. 90 tablet 3  . buPROPion (WELLBUTRIN XL) 150 MG 24 hr tablet Take 2 tablets (300 mg total) by mouth daily. 60 tablet 2  . cetirizine (ZYRTEC) 10 MG tablet Take 1 tablet (10 mg total) by mouth daily. 30 tablet 11  . diclofenac sodium (VOLTAREN) 1 % GEL Apply 2 g topically 4 (four) times daily. 100 g 5  . gabapentin (NEURONTIN) 300 MG capsule Take 2 capsules (600 mg total) by mouth 3 (three) times daily. 180 capsule 2  . lidocaine (XYLOCAINE) 2 % jelly Apply 1 application topically as needed. 30 mL 5  . naproxen (NAPROSYN) 500 MG tablet Take 1 tablet (500 mg total) by mouth 2 (two) times daily with a meal. 60 tablet  1  . predniSONE (DELTASONE) 10 MG tablet Take 1 tablet (10 mg total) by mouth daily with breakfast. Take by mouth 40 mg day 1, 40 mg day 2, 30 mg day 3, 30 mg day 4, 20 mg day 5, 10 mg day 6, 10 mg day 7 18 tablet 0  . pregabalin (LYRICA) 100 MG capsule Take 100 mg by mouth. Reported on 02/01/2016    . traMADol (ULTRAM) 50 MG tablet Take 1 tablet (50 mg total) by mouth every 12 (twelve) hours as needed. 60 tablet 0   No facility-administered medications prior to visit.     ROS Review of Systems  Constitutional: Negative for chills, fatigue, fever and unexpected weight change.  HENT: Negative for dental problem and sore throat.   Eyes: Negative for visual disturbance.  Respiratory: Negative for cough, shortness of breath and wheezing.   Cardiovascular: Negative for chest pain, palpitations and leg swelling.  Gastrointestinal: Negative for abdominal pain, blood in stool, constipation, diarrhea, nausea and vomiting.  Endocrine: Negative for polydipsia, polyphagia and polyuria.  Musculoskeletal: Positive for arthralgias, back pain, gait problem and joint swelling. Negative for myalgias and neck pain.  Skin: Negative for rash.  Allergic/Immunologic: Negative for immunocompromised state.  Hematological: Negative for adenopathy. Does not bruise/bleed easily.  Psychiatric/Behavioral: Negative for dysphoric mood,  sleep disturbance and suicidal ideas. The patient is not nervous/anxious.     Objective:  BP 140/82 (BP Location: Left Arm)   Pulse 66   Temp 97.8 F (36.6 C) (Oral)   Ht 5\' 9"  (1.753 m)   Wt 242 lb 9.6 oz (110 kg)   SpO2 98%   BMI 35.83 kg/m   BP/Weight 04/05/2016 04/02/2016 03/04/2016  Systolic BP 140 117 122  Diastolic BP 82 96 77  Wt. (Lbs) 242.6 245 245  BMI 35.83 36.18 36.16    Physical Exam  Constitutional: He appears well-developed and well-nourished. No distress.  HENT:  Head: Normocephalic and atraumatic.  Neck: Normal range of motion. Neck supple.    Cardiovascular: Normal rate, regular rhythm, normal heart sounds and intact distal pulses.   Pulmonary/Chest: Effort normal and breath sounds normal.  Musculoskeletal: He exhibits no edema.       Left foot: There is tenderness.       Feet:  Neurological: He is alert.  Skin: Skin is warm and dry. No rash noted. No erythema.  Psychiatric: He has a normal mood and affect.     Assessment & Plan:  Karee was seen today for hypertension and foot pain.  Diagnoses and all orders for this visit:  Plantar fasciitis, left -     naproxen (NAPROSYN) 500 MG tablet; Take 1 tablet (500 mg total) by mouth 2 (two) times daily with a meal. -     Ambulatory referral to Podiatry  Degenerative tear of medial meniscus of right knee -     acetaminophen-codeine (TYLENOL #3) 300-30 MG tablet; Take 1 tablet by mouth every 8 (eight) hours as needed for moderate pain.   There are no diagnoses linked to this encounter.  No orders of the defined types were placed in this encounter.   Follow-up: Return in about 4 weeks (around 05/03/2016) for HTN, plantar fascitis .   Dessa PhiJosalyn Amzie Sillas MD

## 2016-04-12 ENCOUNTER — Ambulatory Visit: Payer: Self-pay | Attending: Family Medicine

## 2016-04-24 ENCOUNTER — Ambulatory Visit (INDEPENDENT_AMBULATORY_CARE_PROVIDER_SITE_OTHER): Payer: Self-pay

## 2016-04-24 ENCOUNTER — Ambulatory Visit (INDEPENDENT_AMBULATORY_CARE_PROVIDER_SITE_OTHER): Payer: Self-pay | Admitting: Podiatry

## 2016-04-24 ENCOUNTER — Encounter: Payer: Self-pay | Admitting: Podiatry

## 2016-04-24 VITALS — BP 141/93 | HR 67 | Resp 16 | Ht 69.0 in | Wt 242.0 lb

## 2016-04-24 DIAGNOSIS — M79672 Pain in left foot: Secondary | ICD-10-CM

## 2016-04-24 DIAGNOSIS — M722 Plantar fascial fibromatosis: Secondary | ICD-10-CM

## 2016-04-24 NOTE — Progress Notes (Signed)
   Subjective:    Patient ID: Peter Friedman, male    DOB: 1965-11-09, 50 y.o.   MRN: [id number]  HPI Chief Complaint  Patient presents with  . Foot Pain    Left foot; heel; pt stated, "Pain is unbearable when have to do anything; hurts in the morning"; x2 1/2 weeks      Review of Systems  Musculoskeletal: Positive for gait problem.  All other systems reviewed and are negative.      Objective:   Physical Exam        Assessment & Plan:

## 2016-04-24 NOTE — Patient Instructions (Signed)

## 2016-04-25 NOTE — Progress Notes (Signed)
Subjective:     Patient ID: Peter Friedman, male   DOB: [birth year], 50 y.o.   MRN: [id number]  HPI patient presents pain left heel   Review of Systems     Objective:   Physical Exam Neurovascular status intact with discomfort plantar heel left insertional point tendon calcaneus    Assessment:     Plantar fasciitis left    Plan:     Injected the left plantar fashion 3 mg Kenalog 5 mg Xylocaine and applied fascial brace and instructed on supportive shoe gear usage  X-ray indicates no signs of stress fracture or other bone pathology

## 2016-04-30 ENCOUNTER — Ambulatory Visit (INDEPENDENT_AMBULATORY_CARE_PROVIDER_SITE_OTHER): Payer: Self-pay | Admitting: Sports Medicine

## 2016-04-30 ENCOUNTER — Ambulatory Visit: Payer: Self-pay | Attending: Family Medicine | Admitting: Family Medicine

## 2016-04-30 ENCOUNTER — Encounter: Payer: Self-pay | Admitting: Sports Medicine

## 2016-04-30 ENCOUNTER — Encounter: Payer: Self-pay | Admitting: Family Medicine

## 2016-04-30 VITALS — BP 132/82 | HR 84 | Temp 98.0°F | Ht 69.0 in | Wt 241.8 lb

## 2016-04-30 DIAGNOSIS — M722 Plantar fascial fibromatosis: Secondary | ICD-10-CM

## 2016-04-30 DIAGNOSIS — N529 Male erectile dysfunction, unspecified: Secondary | ICD-10-CM | POA: Insufficient documentation

## 2016-04-30 DIAGNOSIS — Z720 Tobacco use: Secondary | ICD-10-CM

## 2016-04-30 DIAGNOSIS — Z23 Encounter for immunization: Secondary | ICD-10-CM

## 2016-04-30 DIAGNOSIS — M23203 Derangement of unspecified medial meniscus due to old tear or injury, right knee: Secondary | ICD-10-CM

## 2016-04-30 DIAGNOSIS — Z79899 Other long term (current) drug therapy: Secondary | ICD-10-CM | POA: Insufficient documentation

## 2016-04-30 DIAGNOSIS — I10 Essential (primary) hypertension: Secondary | ICD-10-CM | POA: Insufficient documentation

## 2016-04-30 DIAGNOSIS — F17210 Nicotine dependence, cigarettes, uncomplicated: Secondary | ICD-10-CM | POA: Insufficient documentation

## 2016-04-30 DIAGNOSIS — M23231 Derangement of other medial meniscus due to old tear or injury, right knee: Secondary | ICD-10-CM

## 2016-04-30 DIAGNOSIS — M5441 Lumbago with sciatica, right side: Secondary | ICD-10-CM

## 2016-04-30 MED ORDER — SILDENAFIL CITRATE 100 MG PO TABS: 50.0000 mg | ORAL_TABLET | Freq: Every day | ORAL | 11 refills | Status: DC | PRN

## 2016-04-30 MED ORDER — DICLOFENAC SODIUM 1 % TD GEL: 2.0000 g | Freq: Four times a day (QID) | TRANSDERMAL | 5 refills | Status: DC

## 2016-04-30 MED ORDER — VARENICLINE TARTRATE 1 MG PO TABS: 1.0000 mg | ORAL_TABLET | Freq: Two times a day (BID) | ORAL | 2 refills | Status: DC

## 2016-04-30 MED ORDER — AMLODIPINE BESYLATE 10 MG PO TABS: 10.0000 mg | ORAL_TABLET | Freq: Every day | ORAL | 3 refills | Status: DC

## 2016-04-30 MED ORDER — VARENICLINE TARTRATE 0.5 MG X 11 & 1 MG X 42 PO MISC: ORAL | 0 refills | Status: DC

## 2016-04-30 MED FILL — VOLTAREN 1% GEL: 1 | 20 days supply | Qty: 100 | Fill #0

## 2016-04-30 MED FILL — AMLODIPINE BESYLATE 10 MG T: 10 | 30 days supply | Qty: 30 | Fill #0

## 2016-04-30 MED FILL — !VIAGRA 100MG TABLET: 100 | 5 days supply | Qty: 5 | Fill #0

## 2016-04-30 NOTE — Assessment & Plan Note (Signed)
chantix ordered Patient to apply for PASS

## 2016-04-30 NOTE — Assessment & Plan Note (Signed)
Controlled. Continue norvasc

## 2016-04-30 NOTE — Assessment & Plan Note (Signed)
Likely secondary to walking with a limp on his right side in combination with his underlying pes planus and tendency to pronate with ambulation. - Pt provided with arch strap in clinic today - Also given insoles with scaphoid pads - Pt can continue using ice and massaging with a tennis ball - Follow-up in 4 weeks

## 2016-04-30 NOTE — Patient Instructions (Addendum)
Terrius was seen today for hypertension and plantar fasciitis.  Diagnoses and all orders for this visit:  Tobacco abuse -     varenicline (CHANTIX STARTING MONTH PAK) 0.5 MG X 11 & 1 MG X 42 tablet; Taper per packet insert -     varenicline (CHANTIX CONTINUING MONTH PAK) 1 MG tablet; Take 1 tablet (1 mg total) by mouth 2 (two) times daily.  Degenerative tear of medial meniscus of right knee -     diclofenac sodium (VOLTAREN) 1 % GEL; Apply 2 g topically 4 (four) times daily.  Bilateral low back pain with right-sided sciatica -     diclofenac sodium (VOLTAREN) 1 % GEL; Apply 2 g topically 4 (four) times daily.  Essential hypertension -     amLODipine (NORVASC) 10 MG tablet; Take 1 tablet (10 mg total) by mouth daily.  Erectile dysfunction, unspecified erectile dysfunction type -     Discontinue: sildenafil (VIAGRA) 100 MG tablet; Take 0.5-1 tablets (50-100 mg total) by mouth daily as needed for erectile dysfunction. For PASS -     sildenafil (VIAGRA) 100 MG tablet; Take 0.5-1 tablets (50-100 mg total) by mouth daily as needed for erectile dysfunction. For PASS  . For PASS -     sildenafil (VIAGRA) 100 MG tablet; Take 0.5-1 tablets (50-100 mg total) by mouth daily as needed for erectile dysfunction. For PASS   Start chantix: Take one 0.5 mg tablet by mouth once daily for 3 days, then increase to one 0.5 mg tablet twice daily for 4 days, then increase to one 1 mg tablet twice daily. Set quit date for 8-35 days after starting chantix.  F/u in 3 months for HTN   Dr. Armen PickupFunches

## 2016-04-30 NOTE — Assessment & Plan Note (Signed)
Pt has known osteoarthritis of the right knee and is s/p medial meniscal tear. He had a right knee meniscectomy performed in 02/2015. He has had some relief with cortisone injections in the past. After discussing his treatment options with him, he would like to try physical therapy at this point. - Will try physical therapy for now - If PT is not helping, can consider Visco supplementation and surgery. - Follow-up in 4 weeks

## 2016-04-30 NOTE — Progress Notes (Signed)
[facility] Phone: [phone]  Subjective:  Right Knee Pain: Patient presents to clinic for follow-up of his right knee pain. He has had right knee pain for years. He has a history of right medial meniscal tear s/p repair in July 2016. He has been treated with 2 cortisone injections in the past, with the last one being performed in 02/2016. He states that this injection helped for a few weeks, but the pain returned. He continues to have pain located in his anterior medial knee. The pain is worse when it is raining outside and when he has been on his feet a lot. The pain is better with rest. He also endorses some popping and his knee will occasionally give out. He was previously on Naprosyn for ~1 week, which didn't help. He is currently taking Tylenol for pain. He has also been icing his knee intermittently. At his last visit, the plan was for him to start PT. His financial assistance ran out and he was not able to go to PT. Since then, he has obtained financial assistance again and would like to try PT before pursuing other treatment options.   Left Foot Pain: Patient also has developed new left foot pain starting 2-3 weeks ago. The pain is worse when he is on his feet a lot and right when he stands up after sitting down for a while. He was seen by Podiatry on 9/6, diagnosed with plantar fasciitis, and given a cortisone injection. He states the injection has helped a little. He has tried icing his foot and rolling his arch on a tennis ball. Both of these have given him a little relief. He has also been wearing an ankle brace that was given to him by the Podiatrist, which gives his arch a little support. He doesn't think this brace has been helping much.  ROS: See HPI for pertinent positives and negatives  Past Medical History- HTN, prediabetes  Social history- patient is a current smoker  Objective: BP 126/88   Pulse 75   Ht 5\' 9"  (1.753 m)   Wt 242 lb (109.8 kg)    BMI 35.74 kg/m  Gen: NAD, alert, cooperative with exam Right Knee: No erythema, edema, or gross deformity. Tenderness to palpation over medial joint line. Full ROM. Lachman's test negative. Valgus and varus stress tests negative. Thessaly's test negative. Distal leg is neurovascularly intact. Left Foot: Pes planus present bilaterally. Pronation noted during ambulation. Pt has point tenderness to palpation over the medial plantar calcaneal region. Neuro: 5/5 muscle strength in lower extremities bilaterally.  Assessment/Plan: Right Knee Pain: Pt has known osteoarthritis of the right knee and is s/p medial meniscal tear. He had a right knee meniscectomy performed in 02/2015. He has had some relief with cortisone injections in the past. After discussing his treatment options with him, he would like to try physical therapy at this point. - Will try physical therapy for now - If PT is not helping, can consider Visco supplementation and surgery. - Follow-up in 4 weeks  Left Plantar Fasciitis: Likely secondary to walking with a limp on his right side in combination with his underlying pes planus and tendency to pronate with ambulation. - Pt provided with arch strap in clinic today - Also given insoles with scaphoid pads - Pt can continue using ice and massaging with a tennis ball - Follow-up in 4 weeks   Peter CarolKaty Mayo, MD PGY-2  Patient seen and evaluated with the resident. I agree with the  above plan of care.

## 2016-04-30 NOTE — Assessment & Plan Note (Signed)
Improving Continue current regimen  

## 2016-04-30 NOTE — Assessment & Plan Note (Signed)
ED Trial of Viagra Counseled patient on risk of HA, decrease in BP, contraindication of viagra with nitroglycerin

## 2016-04-30 NOTE — Progress Notes (Signed)
Subjective:  Patient ID: Peter Friedman, male    DOB: 07-25-1966  Age: 50 y.o. MRN: [id number]  CC: Hypertension and Plantar Fasciitis   HPI Peter Friedman presents for    1. HTN: taking amlodipine 10 mg daily. Not compliant with low salt diet. Had coffee, ham and egg sandwich today. No HA, CP, SOB, leg swelling or vision changes.   2.L plantar fascitis : improving. He went to podiatry and got a steroid shot at [facility] on 04/24/16.  He has iced the area. He is using shoe inserts and foot compression.  Pain in L posterior heel. No injury. No redness or swelling. Pain is worse when standing from sitting or laying down.   3. Erectile dysfunction: for a little under a year. Trouble getting keeping the erection. Has not tried prescription medication in the past. Admits to stress related to his health.   Social History  Substance Use Topics  . Smoking status: Light Tobacco Smoker    Packs/day: 0.50    Types: Cigarettes  . Smokeless tobacco: Never Used  . Alcohol use No   Outpatient Medications Prior to Visit  Medication Sig Dispense Refill  . acetaminophen-codeine (TYLENOL #3) 300-30 MG tablet Take 1 tablet by mouth every 8 (eight) hours as needed for moderate pain. 90 tablet 2  . albuterol (PROVENTIL HFA;VENTOLIN HFA) 108 (90 Base) MCG/ACT inhaler Inhale 2 puffs into the lungs every 6 (six) hours as needed for wheezing or shortness of breath. 1 Inhaler 0  . amLODipine (NORVASC) 10 MG tablet Take 1 tablet (10 mg total) by mouth daily. 90 tablet 3  . buPROPion (WELLBUTRIN XL) 150 MG 24 hr tablet Take 2 tablets (300 mg total) by mouth daily. 60 tablet 2  . cetirizine (ZYRTEC) 10 MG tablet Take 1 tablet (10 mg total) by mouth daily. 30 tablet 11  . diclofenac sodium (VOLTAREN) 1 % GEL Apply 2 g topically 4 (four) times daily. 100 g 5  . lidocaine (XYLOCAINE) 2 % jelly Apply 1 application topically as needed. 30 mL 5  . naproxen (NAPROSYN) 500 MG tablet Take 1 tablet (500 mg  total) by mouth 2 (two) times daily with a meal. 60 tablet 1  . pregabalin (LYRICA) 100 MG capsule Take 100 mg by mouth. Reported on 02/01/2016     No facility-administered medications prior to visit.     ROS Review of Systems  Constitutional: Negative for chills, fatigue, fever and unexpected weight change.  HENT: Negative for dental problem and sore throat.   Eyes: Negative for visual disturbance.  Respiratory: Negative for cough, shortness of breath and wheezing.   Cardiovascular: Negative for chest pain, palpitations and leg swelling.  Gastrointestinal: Negative for abdominal pain, blood in stool, constipation, diarrhea, nausea and vomiting.  Endocrine: Negative for polydipsia, polyphagia and polyuria.  Musculoskeletal: Positive for arthralgias, back pain, gait problem and joint swelling. Negative for myalgias and neck pain.  Skin: Negative for rash.  Allergic/Immunologic: Negative for immunocompromised state.  Hematological: Negative for adenopathy. Does not bruise/bleed easily.  Psychiatric/Behavioral: Negative for dysphoric mood, sleep disturbance and suicidal ideas. The patient is not nervous/anxious.     Objective:  BP 132/82 (BP Location: Left Arm, Patient Position: Sitting, Cuff Size: Small)   Pulse 84   Temp 98 F (36.7 C) (Oral)   Ht 5\' 9"  (1.753 m)   Wt 241 lb 12.8 oz (109.7 kg)   SpO2 99%   BMI 35.71 kg/m   BP/Weight 04/30/2016 04/30/2016 04/24/2016  Systolic BP 132 126 141  Diastolic BP 82 88 93  Wt. (Lbs) 241.8 242 242  BMI 35.71 35.74 35.74   Physical Exam  Constitutional: He appears well-developed and well-nourished. No distress.  HENT:  Head: Normocephalic and atraumatic.  Neck: Normal range of motion. Neck supple.  Cardiovascular: Normal rate, regular rhythm, normal heart sounds and intact distal pulses.   Pulmonary/Chest: Effort normal and breath sounds normal.  Musculoskeletal: He exhibits no edema.       Left foot: There is tenderness.        Feet:  Neurological: He is alert.  Skin: Skin is warm and dry. No rash noted. No erythema.  Psychiatric: He has a normal mood and affect.     Assessment & Plan:  Peter Friedman was seen today for hypertension and plantar fasciitis.  Diagnoses and all orders for this visit:  Tobacco abuse -     varenicline (CHANTIX STARTING MONTH PAK) 0.5 MG X 11 & 1 MG X 42 tablet; Taper per packet insert -     varenicline (CHANTIX CONTINUING MONTH PAK) 1 MG tablet; Take 1 tablet (1 mg total) by mouth 2 (two) times daily.  Degenerative tear of medial meniscus of right knee -     diclofenac sodium (VOLTAREN) 1 % GEL; Apply 2 g topically 4 (four) times daily.  Bilateral low back pain with right-sided sciatica -     diclofenac sodium (VOLTAREN) 1 % GEL; Apply 2 g topically 4 (four) times daily.  Essential hypertension -     amLODipine (NORVASC) 10 MG tablet; Take 1 tablet (10 mg total) by mouth daily.  Erectile dysfunction, unspecified erectile dysfunction type -     Discontinue: sildenafil (VIAGRA) 100 MG tablet; Take 0.5-1 tablets (50-100 mg total) by mouth daily as needed for erectile dysfunction. For PASS -     sildenafil (VIAGRA) 100 MG tablet; Take 0.5-1 tablets (50-100 mg total) by mouth daily as needed for erectile dysfunction. For PASS   There are no diagnoses linked to this encounter.  No orders of the defined types were placed in this encounter.   Follow-up: Return in about 3 months (around 07/30/2016) for HTN .   Dessa Phi MD

## 2016-05-06 MED FILL — ACETAMINOPHEN/COD #3 TABLET: 300-30 | 30 days supply | Qty: 90 | Fill #1

## 2016-05-28 ENCOUNTER — Ambulatory Visit: Payer: Self-pay | Admitting: Sports Medicine

## 2016-06-11 MED FILL — AMLODIPINE BESYLATE 10 MG T: 10 | 90 days supply | Qty: 90 | Fill #1

## 2016-06-12 ENCOUNTER — Encounter: Payer: Self-pay | Admitting: Sports Medicine

## 2016-06-12 ENCOUNTER — Ambulatory Visit (INDEPENDENT_AMBULATORY_CARE_PROVIDER_SITE_OTHER): Payer: Self-pay | Admitting: Sports Medicine

## 2016-06-12 VITALS — BP 146/77 | Ht 69.0 in | Wt 242.0 lb

## 2016-06-12 DIAGNOSIS — M545 Low back pain, unspecified: Secondary | ICD-10-CM

## 2016-06-12 MED ORDER — DICLOFENAC SODIUM 75 MG PO TBEC
75.0000 mg | DELAYED_RELEASE_TABLET | Freq: Two times a day (BID) | ORAL | 1 refills | Status: DC | PRN
Start: 2016-06-12 — End: 2016-11-01

## 2016-06-12 MED FILL — ?DICLOFENAC SOD DR 75 MG TA: 75 | 30 days supply | Qty: 60 | Fill #0

## 2016-06-12 MED FILL — ACETAMINOPHEN/COD #3 TABLET: 300-30 | 30 days supply | Qty: 90 | Fill #2

## 2016-06-12 NOTE — Progress Notes (Signed)
  Patient comes in today for follow-up on right knee osteoarthritis and left foot plantar fasciitis. Both are tolerable. He has not yet started physical therapy for his right knee osteoarthritis due to some family issues. Naproxen sodium and tramadol have not been helpful. He is also complaining of some low back pain that started a couple of weeks ago. No radiating pain into his legs. No numbness or tingling. No problems with his back in the past. He is wearing a lumbar brace which does seem to be helpful. He has not had any imaging.   Physical exam: Well-developed, well-nourished. No acute distress. Examination of the right knee shows good range of motion. No effusion. He is tender to palpation over the medial joint line. Knee is stable to ligamentous exam. Examination of the lumbar spine shows limited range of motion secondary to pain. No tenderness to palpation along the lumbosacral area. He does have reproducible pain with forward flexion;  no pain with extension. No gross neurological deficits of either lower extremity.   Assessment/plan:  1. Right knee DJD 2. Low back pain secondary to lumbar strain versus degenerative disc disease  For both his low back pain and his right knee DJD we will try some Voltaren 75 mg twice daily with food. He will start physical therapy when he is able. I will also get some x-rays of his lumbar spine and call him with those results.

## 2016-06-14 ENCOUNTER — Ambulatory Visit
Admission: RE | Admit: 2016-06-14 | Discharge: 2016-06-14 | Disposition: A | Discharge status: Home / Self Care | Payer: No Typology Code available for payment source | Source: Ambulatory Visit | Attending: Sports Medicine | Admitting: Sports Medicine

## 2016-06-14 ENCOUNTER — Telehealth: Payer: Self-pay | Admitting: Sports Medicine

## 2016-06-14 DIAGNOSIS — M545 Low back pain, unspecified: Secondary | ICD-10-CM

## 2016-06-14 NOTE — Telephone Encounter (Signed)
  I spoke with the patient on the phone today after reviewing x-rays of his lumbar spine. He has some mild degenerative disc changes at L3-L4 and L4-L5. He also has some straightening of the normal lordosis consistent with spasm. His pain is slowly improving. He is utilizing heat as well as a lumbar corset. He will let me know if symptoms persist or worsen.

## 2016-07-16 ENCOUNTER — Other Ambulatory Visit: Payer: Self-pay | Admitting: Family Medicine

## 2016-07-16 DIAGNOSIS — M23203 Derangement of unspecified medial meniscus due to old tear or injury, right knee: Secondary | ICD-10-CM

## 2016-07-18 NOTE — Telephone Encounter (Signed)
Please inform patient that tylenol #3 Rx ready for pick up

## 2016-07-18 NOTE — Telephone Encounter (Signed)
Pt called and informed of script being ready for pick up. 

## 2016-07-19 MED FILL — ACETAMINOPHEN/COD #3 TABLET: 300-30 | 30 days supply | Qty: 90 | Fill #0

## 2016-07-31 ENCOUNTER — Ambulatory Visit: Payer: Self-pay | Attending: Internal Medicine

## 2016-08-01 ENCOUNTER — Ambulatory Visit: Payer: Self-pay | Attending: Family Medicine | Admitting: Family Medicine

## 2016-08-01 ENCOUNTER — Encounter: Payer: Self-pay | Admitting: Family Medicine

## 2016-08-01 VITALS — BP 133/82 | HR 72 | Temp 98.0°F | Ht 69.0 in | Wt 242.2 lb

## 2016-08-01 DIAGNOSIS — Z79899 Other long term (current) drug therapy: Secondary | ICD-10-CM | POA: Insufficient documentation

## 2016-08-01 DIAGNOSIS — I10 Essential (primary) hypertension: Secondary | ICD-10-CM

## 2016-08-01 DIAGNOSIS — M722 Plantar fascial fibromatosis: Secondary | ICD-10-CM

## 2016-08-01 DIAGNOSIS — Z87891 Personal history of nicotine dependence: Secondary | ICD-10-CM | POA: Insufficient documentation

## 2016-08-01 MED ORDER — AMLODIPINE BESYLATE 10 MG PO TABS: 10.0000 mg | ORAL_TABLET | Freq: Every day | ORAL | 11 refills | Status: DC

## 2016-08-01 MED ORDER — NAPROXEN 500 MG PO TABS: 500.0000 mg | ORAL_TABLET | Freq: Two times a day (BID) | ORAL | 1 refills | Status: DC

## 2016-08-01 NOTE — Progress Notes (Signed)
Pt is here today to follow up on HTN. Pt is also having foot pain(left foot).

## 2016-08-01 NOTE — Patient Instructions (Addendum)
Neal was seen today for hypertension.  Diagnoses and all orders for this visit:  Essential hypertension  Plantar fasciitis, left -     naproxen (NAPROSYN) 500 MG tablet; Take 1 tablet (500 mg total) by mouth 2 (two) times daily with a meal.   BP at goal Continue low salt diet Work on exercise with goal of a little weight loss It is best to have BP < 130/80  Return to podiatry for plantar fasciitis for another steroid injection followed by:  Ice feet for 15 minutes 2-3 times per day by doing the following  1. Freezing water in plastic bottle and rolling bottle under foot Or Placing an ice pack under under heel    Plantar Fasciitis Rehab Ask your health care provider which exercises are safe for you. Do exercises exactly as told by your health care provider and adjust them as directed. It is normal to feel mild stretching, pulling, tightness, or discomfort as you do these exercises, but you should stop right away if you feel sudden pain or your pain gets worse. Do not begin these exercises until told by your health care provider. Stretching and range of motion exercises These exercises warm up your muscles and joints and improve the movement and flexibility of your foot. These exercises also help to relieve pain. Exercise A: Plantar fascia stretch 1. Sit with your left / right leg crossed over your opposite knee. 2. Hold your heel with one hand with that thumb near your arch. With your other hand, hold your toes and gently pull them back toward the top of your foot. You should feel a stretch on the bottom of your toes or your foot or both. 3. Hold this stretch for__________ seconds. 4. Slowly release your toes and return to the starting position. Repeat __________ times. Complete this exercise __________ times a day. Exercise B: Gastroc, standing 1. Stand with your hands against a wall. 2. Extend your left / right leg behind you, and bend your front knee slightly. 3. Keeping your  heels on the floor and keeping your back knee straight, shift your weight toward the wall without arching your back. You should feel a gentle stretch in your left / right calf. 4. Hold this position for __________ seconds. Repeat __________ times. Complete this exercise __________ times a day. Exercise C: Soleus, standing 1. Stand with your hands against a wall. 2. Extend your left / right leg behind you, and bend your front knee slightly. 3. Keeping your heels on the floor, bend your back knee and slightly shift your weight over the back leg. You should feel a gentle stretch deep in your calf. 4. Hold this position for __________ seconds. Repeat __________ times. Complete this exercise __________ times a day. Exercise D: Gastrocsoleus, standing 1. Stand with the ball of your left / right foot on a step. The ball of your foot is on the walking surface, right under your toes. 2. Keep your other foot firmly on the same step. 3. Hold onto the wall or a railing for balance. 4. Slowly lift your other foot, allowing your body weight to press your heel down over the edge of the step. You should feel a stretch in your left / right calf. 5. Hold this position for __________ seconds. 6. Return both feet to the step. 7. Repeat this exercise with a slight bend in your left / right knee. Repeat __________ times with your left / right knee straight and __________ times with your left /  right knee bent. Complete this exercise __________ times a day. Balance exercise This exercise builds your balance and strength control of your arch to help take pressure off your plantar fascia. Exercise E: Single leg stand 1. Without shoes, stand near a railing or in a doorway. You may hold onto the railing or door frame as needed. 2. Stand on your left / right foot. Keep your big toe down on the floor and try to keep your arch lifted. Do not let your foot roll inward. 3. Hold this position for __________ seconds. 4. If  this exercise is too easy, you can try it with your eyes closed or while standing on a pillow. Repeat __________ times. Complete this exercise __________ times a day. This information is not intended to replace advice given to you by your health care provider. Make sure you discuss any questions you have with your health care provider. Document Released: 08/05/2005 Document Revised: 04/09/2016 Document Reviewed: 06/19/2015 Elsevier Interactive Patient Education  2017 [employer].   Dr. Armen PickupFunches

## 2016-08-01 NOTE — Assessment & Plan Note (Signed)
Improved Continue Norvasc 10 mg daily Continue low salt diet Work on weight loss

## 2016-08-01 NOTE — Progress Notes (Signed)
Subjective:  Patient ID: Peter Friedman, male    DOB: [birth year]  Age: 50 y.o. MRN: [id number]  CC: Hypertension   HPI Peter Friedman presents for    1. HTN: taking amlodipine 10 mg daily. Eating low salt diet now.  No HA, CP, SOB, leg swelling or vision changes.   2. L plantar fascitis :wrosening over last month.  He went to podiatry and got a steroid shot at [facility] on 04/24/16.  He has iced the area. He is using shoe inserts and foot compression.  Pain in L posterior heel. No injury. No redness or swelling. Pain is worse when standing from sitting or laying down and prolonged walking.   Social History  Substance Use Topics  . Smoking status: Former Smoker    Packs/day: 0.50    Types: Cigarettes  . Smokeless tobacco: Never Used  . Alcohol use No   Outpatient Medications Prior to Visit  Medication Sig Dispense Refill  . acetaminophen-codeine (TYLENOL #3) 300-30 MG tablet TAKE 1 TABLET BY MOUTH EVERY 8 HOURS AS NEEDED FOR MODERATE PAIN 90 tablet 2  . albuterol (PROVENTIL HFA;VENTOLIN HFA) 108 (90 Base) MCG/ACT inhaler Inhale 2 puffs into the lungs every 6 (six) hours as needed for wheezing or shortness of breath. 1 Inhaler 0  . amLODipine (NORVASC) 10 MG tablet Take 1 tablet (10 mg total) by mouth daily. 90 tablet 3  . diclofenac (VOLTAREN) 75 MG EC tablet Take 1 tablet (75 mg total) by mouth 2 (two) times daily as needed. 60 tablet 1  . diclofenac sodium (VOLTAREN) 1 % GEL Apply 2 g topically 4 (four) times daily. 100 g 5  . naproxen (NAPROSYN) 500 MG tablet Take 1 tablet (500 mg total) by mouth 2 (two) times daily with a meal. 60 tablet 1  . pregabalin (LYRICA) 100 MG capsule Take 100 mg by mouth. Reported on 02/01/2016    . sildenafil (VIAGRA) 100 MG tablet Take 0.5-1 tablets (50-100 mg total) by mouth daily as needed for erectile dysfunction. For PASS 5 tablet 11  . varenicline (CHANTIX CONTINUING MONTH PAK) 1 MG tablet Take 1 tablet (1 mg total) by mouth 2 (two)  times daily. 60 tablet 2  . varenicline (CHANTIX STARTING MONTH PAK) 0.5 MG X 11 & 1 MG X 42 tablet Taper per packet insert 53 tablet 0   No facility-administered medications prior to visit.     ROS Review of Systems  Constitutional: Negative for chills, fatigue, fever and unexpected weight change.  HENT: Negative for dental problem and sore throat.   Eyes: Negative for visual disturbance.  Respiratory: Negative for cough, shortness of breath and wheezing.   Cardiovascular: Negative for chest pain, palpitations and leg swelling.  Gastrointestinal: Negative for abdominal pain, blood in stool, constipation, diarrhea, nausea and vomiting.  Endocrine: Negative for polydipsia, polyphagia and polyuria.  Musculoskeletal: Positive for arthralgias, back pain, gait problem and joint swelling. Negative for myalgias and neck pain.  Skin: Negative for rash.  Allergic/Immunologic: Negative for immunocompromised state.  Hematological: Negative for adenopathy. Does not bruise/bleed easily.  Psychiatric/Behavioral: Negative for dysphoric mood, sleep disturbance and suicidal ideas. The patient is not nervous/anxious.     Objective:  BP 133/82 (BP Location: Left Arm, Patient Position: Sitting, Cuff Size: Large)   Pulse 72   Temp 98 F (36.7 C) (Oral)   Ht 5\' 9"  (1.753 m)   Wt 242 lb 3.2 oz (109.9 kg)   SpO2 99%   BMI 35.77 kg/m  BP/Weight 08/01/2016 06/12/2016 04/30/2016  Systolic BP 133 146 132  Diastolic BP 82 77 82  Wt. (Lbs) 242.2 242 241.8  BMI 35.77 35.74 35.71   Physical Exam  Constitutional: He appears well-developed and well-nourished. No distress.  HENT:  Head: Normocephalic and atraumatic.  Neck: Normal range of motion. Neck supple.  Cardiovascular: Normal rate, regular rhythm, normal heart sounds and intact distal pulses.   Pulmonary/Chest: Effort normal and breath sounds normal.  Musculoskeletal: He exhibits no edema.       Left foot: There is tenderness.  Neurological: He  is alert.  Skin: Skin is warm and dry. No rash noted. No erythema.  Psychiatric: He has a normal mood and affect.     Assessment & Plan:  Einar was seen today for hypertension.  Diagnoses and all orders for this visit:  Essential hypertension  Plantar fasciitis, left -     naproxen (NAPROSYN) 500 MG tablet; Take 1 tablet (500 mg total) by mouth 2 (two) times daily with a meal.   There are no diagnoses linked to this encounter.  No orders of the defined types were placed in this encounter.   Follow-up: Return in about 3 months (around 10/30/2016) for HTN, HIV screen, Tdap .   Dessa PhiJosalyn Marnesha Gagen MD

## 2016-08-01 NOTE — Assessment & Plan Note (Signed)
chronic worsened Restart naproxen Weight loss Home Ice and PT Return to podiatry

## 2016-08-02 ENCOUNTER — Ambulatory Visit: Payer: Self-pay | Admitting: Family Medicine

## 2016-08-22 MED FILL — ACETAMINOPHEN/COD #3 TABLET: 300-30 | 30 days supply | Qty: 90 | Fill #1

## 2016-09-13 ENCOUNTER — Telehealth: Payer: Self-pay | Admitting: Family Medicine

## 2016-09-13 NOTE — Telephone Encounter (Signed)
Requesting to speak to nurse. Refused to give any information. States it is personal

## 2016-09-13 NOTE — Telephone Encounter (Signed)
Returned pt call pt states he has a court case coming up on Sep 24, 2016 for child support. Pt states he has been unemployed and he has applied for disability. Pt states he is wanting to know would Dr. Armen PickupFunches be able to write a letter explaining his health situation and stating he can not work. Pt states that the disability people are going to write him a letter as well so he can take it to court with him.   Pt is requesting his medical record from the past 8 months I informed pt he would have to come in and sign a medical release form and the process takes up to 2 weeks(per [place]). I informed pt and he states he will come in Monday to fill out the form and he will call his attorney to make him aware of the process for his medical records

## 2016-09-19 ENCOUNTER — Telehealth: Payer: Self-pay | Admitting: Family Medicine

## 2016-09-19 NOTE — Telephone Encounter (Signed)
Patient called to follow up on the status of the letter that he wants PCP to write to judge. Pt's court date is 2/618. Please follow up.  Thank you.

## 2016-09-20 MED FILL — AMLODIPINE BESYLATE 10 MG T: 10 | 90 days supply | Qty: 90 | Fill #0

## 2016-09-20 NOTE — Telephone Encounter (Signed)
Letter written

## 2016-09-20 NOTE — Telephone Encounter (Signed)
Please inform patient that letter written and ready for pick up  

## 2016-09-20 NOTE — Telephone Encounter (Signed)
Pt was called and informed of letter being ready for pick up. 

## 2016-09-20 NOTE — Telephone Encounter (Signed)
Will route to PCP 

## 2016-09-30 MED FILL — ACETAMINOPHEN/COD #3 TABLET: 300-30 | 30 days supply | Qty: 90 | Fill #2

## 2016-10-01 ENCOUNTER — Telehealth: Payer: Self-pay | Admitting: Family Medicine

## 2016-10-01 DIAGNOSIS — M23203 Derangement of unspecified medial meniscus due to old tear or injury, right knee: Secondary | ICD-10-CM

## 2016-10-01 DIAGNOSIS — R0989 Other specified symptoms and signs involving the circulatory and respiratory systems: Secondary | ICD-10-CM

## 2016-10-01 NOTE — Telephone Encounter (Signed)
Patient states that he is just getting over a bad cold or the flu and has some chest congestion that will not go away. Would like to know if there is anything that he can be prescribed to break up the congestion. Please f/u.

## 2016-10-02 NOTE — Telephone Encounter (Signed)
Will route to PCP 

## 2016-10-04 MED ORDER — GUAIFENESIN ER 600 MG PO TB12: 600.0000 mg | ORAL_TABLET | Freq: Two times a day (BID) | ORAL | 0 refills | Status: DC | PRN

## 2016-10-04 NOTE — Telephone Encounter (Signed)
Pt was called and informed of medication to take for his cough.

## 2016-10-04 NOTE — Telephone Encounter (Signed)
mucinex is the best for breaking up congestion He is advised to make a f/u appt if he have fever, chest pain, shortness of breath

## 2016-10-10 MED ORDER — ACETAMINOPHEN-CODEINE #3 300-30 MG PO TABS: ORAL_TABLET | ORAL | 2 refills | Status: DC

## 2016-10-10 NOTE — Telephone Encounter (Signed)
Please call patient Tylenol #3 is ready for pick up

## 2016-10-10 NOTE — Telephone Encounter (Signed)
Pt. Came into facility requesting a refill on Tylenol # 3.  °Please f/u  °

## 2016-10-11 NOTE — Telephone Encounter (Signed)
Pt was called and informed of script being ready for pick up.

## 2016-11-01 ENCOUNTER — Ambulatory Visit: Payer: Self-pay | Attending: Family Medicine | Admitting: Family Medicine

## 2016-11-01 ENCOUNTER — Encounter: Payer: Self-pay | Admitting: Family Medicine

## 2016-11-01 VITALS — BP 128/75 | HR 78 | Temp 97.9°F | Ht 69.0 in | Wt 236.6 lb

## 2016-11-01 DIAGNOSIS — Z79899 Other long term (current) drug therapy: Secondary | ICD-10-CM | POA: Insufficient documentation

## 2016-11-01 DIAGNOSIS — Z87891 Personal history of nicotine dependence: Secondary | ICD-10-CM | POA: Insufficient documentation

## 2016-11-01 DIAGNOSIS — N529 Male erectile dysfunction, unspecified: Secondary | ICD-10-CM

## 2016-11-01 DIAGNOSIS — L309 Dermatitis, unspecified: Secondary | ICD-10-CM | POA: Insufficient documentation

## 2016-11-01 DIAGNOSIS — Z23 Encounter for immunization: Secondary | ICD-10-CM

## 2016-11-01 DIAGNOSIS — Z114 Encounter for screening for human immunodeficiency virus [HIV]: Secondary | ICD-10-CM

## 2016-11-01 DIAGNOSIS — I10 Essential (primary) hypertension: Secondary | ICD-10-CM

## 2016-11-01 DIAGNOSIS — Z720 Tobacco use: Secondary | ICD-10-CM

## 2016-11-01 MED ORDER — VARENICLINE TARTRATE 0.5 MG X 11 & 1 MG X 42 PO MISC: ORAL | 0 refills | Status: DC

## 2016-11-01 MED ORDER — TRIAMCINOLONE ACETONIDE 0.1 % EX CREA: 1.0000 "application " | TOPICAL_CREAM | Freq: Two times a day (BID) | CUTANEOUS | 2 refills | Status: DC

## 2016-11-01 MED ORDER — VARENICLINE TARTRATE 1 MG PO TABS: 1.0000 mg | ORAL_TABLET | Freq: Two times a day (BID) | ORAL | 2 refills | Status: DC

## 2016-11-01 MED ORDER — SILDENAFIL CITRATE 100 MG PO TABS: 50.0000 mg | ORAL_TABLET | Freq: Every day | ORAL | 11 refills | Status: DC | PRN

## 2016-11-01 MED FILL — ?TRIAMCINOLONE 0.1% CREAM: 0.1 | 30 days supply | Qty: 60 | Fill #0

## 2016-11-01 NOTE — Patient Instructions (Addendum)
Peter Friedman was seen today for hypertension.  Diagnoses and all orders for this visit:  Essential hypertension  Tobacco abuse -     varenicline (CHANTIX STARTING MONTH PAK) 0.5 MG X 11 & 1 MG X 42 tablet; Taper per packet insert -     varenicline (CHANTIX CONTINUING MONTH PAK) 1 MG tablet; Take 1 tablet (1 mg total) by mouth 2 (two) times daily.  Erectile dysfunction, unspecified erectile dysfunction type -     sildenafil (VIAGRA) 100 MG tablet; Take 0.5-1 tablets (50-100 mg total) by mouth daily as needed for erectile dysfunction. For PASS  Hand dermatitis -     triamcinolone cream (KENALOG) 0.1 %; Apply 1 application topically 2 (two) times daily.  Screening for HIV (human immunodeficiency virus) -     HIV antibody (with reflex)  call if skin peeling does not improve or worsens  with kenalog for change of therapy to antifungal cream  f/u in 3 months for HTN  Dr. Armen PickupFunches

## 2016-11-01 NOTE — Progress Notes (Signed)
Subjective:  Patient ID: Peter Friedman, male    DOB: 1966/06/26  Age: 51 y.o. MRN: [id number]  CC: Hypertension   HPI Peter Friedman presents for    1. HTN: taking amlodipine 10 mg daily. Eating low salt diet now.  No HA, CP, SOB, leg swelling or vision changes.   2. Hand peeling: started 7-10 days ago. Slight itching. He was weeding a few weeks ago. No peeling in any other area. Also with some peeling of the foot. No rash or redness.    Social History  Substance Use Topics  . Smoking status: Former Smoker    Packs/day: 0.50    Types: Cigarettes  . Smokeless tobacco: Never Used  . Alcohol use No   Outpatient Medications Prior to Visit  Medication Sig Dispense Refill  . acetaminophen-codeine (TYLENOL #3) 300-30 MG tablet TAKE 1 TABLET BY MOUTH EVERY 8 HOURS AS NEEDED FOR MODERATE PAIN 90 tablet 2  . albuterol (PROVENTIL HFA;VENTOLIN HFA) 108 (90 Base) MCG/ACT inhaler Inhale 2 puffs into the lungs every 6 (six) hours as needed for wheezing or shortness of breath. 1 Inhaler 0  . amLODipine (NORVASC) 10 MG tablet Take 1 tablet (10 mg total) by mouth daily. 30 tablet 11  . diclofenac (VOLTAREN) 75 MG EC tablet Take 1 tablet (75 mg total) by mouth 2 (two) times daily as needed. 60 tablet 1  . diclofenac sodium (VOLTAREN) 1 % GEL Apply 2 g topically 4 (four) times daily. 100 g 5  . guaiFENesin (MUCINEX) 600 MG 12 hr tablet Take 1 tablet (600 mg total) by mouth 2 (two) times daily as needed. 30 tablet 0  . naproxen (NAPROSYN) 500 MG tablet Take 1 tablet (500 mg total) by mouth 2 (two) times daily with a meal. 60 tablet 1  . pregabalin (LYRICA) 100 MG capsule Take 100 mg by mouth. Reported on 02/01/2016    . sildenafil (VIAGRA) 100 MG tablet Take 0.5-1 tablets (50-100 mg total) by mouth daily as needed for erectile dysfunction. For PASS 5 tablet 11  . varenicline (CHANTIX CONTINUING MONTH PAK) 1 MG tablet Take 1 tablet (1 mg total) by mouth 2 (two) times daily. 60 tablet 2  .  varenicline (CHANTIX STARTING MONTH PAK) 0.5 MG X 11 & 1 MG X 42 tablet Taper per packet insert 53 tablet 0   No facility-administered medications prior to visit.     ROS Review of Systems  Constitutional: Negative for chills, fatigue, fever and unexpected weight change.  HENT: Negative for dental problem and sore throat.   Eyes: Negative for visual disturbance.  Respiratory: Negative for cough, shortness of breath and wheezing.   Cardiovascular: Negative for chest pain, palpitations and leg swelling.  Gastrointestinal: Negative for abdominal pain, blood in stool, constipation, diarrhea, nausea and vomiting.  Endocrine: Negative for polydipsia, polyphagia and polyuria.  Musculoskeletal: Positive for arthralgias, back pain, gait problem and joint swelling. Negative for myalgias and neck pain.  Skin: Negative for rash.  Allergic/Immunologic: Negative for immunocompromised state.  Hematological: Negative for adenopathy. Does not bruise/bleed easily.  Psychiatric/Behavioral: Negative for dysphoric mood, sleep disturbance and suicidal ideas. The patient is not nervous/anxious.     Objective:  BP 128/75   Pulse 78   Temp 97.9 F (36.6 C) (Oral)   Ht 5\' 9"  (1.753 m)   Wt 236 lb 9.6 oz (107.3 kg)   SpO2 97%   BMI 34.94 kg/m   BP/Weight 11/01/2016 08/01/2016 06/12/2016  Systolic BP 128 133 146  Diastolic BP 75 82 77  Wt. (Lbs) 236.6 242.2 242  BMI 34.94 35.77 35.74   Physical Exam  Constitutional: He appears well-developed and well-nourished. No distress.  HENT:  Head: Normocephalic and atraumatic.  Neck: Normal range of motion. Neck supple.  Cardiovascular: Normal rate, regular rhythm, normal heart sounds and intact distal pulses.   Pulmonary/Chest: Effort normal and breath sounds normal.  Musculoskeletal: He exhibits no edema.  Neurological: He is alert.  Skin: Skin is warm and dry. No rash noted. No erythema.     Psychiatric: He has a normal mood and affect.   Assessment  & Plan:  Peter Friedman was seen today for hypertension.  Diagnoses and all orders for this visit:  Essential hypertension  Tobacco abuse -     varenicline (CHANTIX STARTING MONTH PAK) 0.5 MG X 11 & 1 MG X 42 tablet; Taper per packet insert -     varenicline (CHANTIX CONTINUING MONTH PAK) 1 MG tablet; Take 1 tablet (1 mg total) by mouth 2 (two) times daily.  Erectile dysfunction, unspecified erectile dysfunction type -     sildenafil (VIAGRA) 100 MG tablet; Take 0.5-1 tablets (50-100 mg total) by mouth daily as needed for erectile dysfunction. For PASS  Hand dermatitis -     triamcinolone cream (KENALOG) 0.1 %; Apply 1 application topically 2 (two) times daily.  Screening for HIV (human immunodeficiency virus) -     HIV antibody (with reflex)   There are no diagnoses linked to this encounter.  No orders of the defined types were placed in this encounter.   Follow-up: Return in about 3 months (around 02/01/2017) for HTN .   Dessa PhiJosalyn Brigett Estell MD

## 2016-11-01 NOTE — Addendum Note (Signed)
Addended by: Ronette DeterFARRINGTON, Jayvin Hurrell V on: 11/01/2016 02:35 PM   Modules accepted: Orders

## 2016-11-01 NOTE — Assessment & Plan Note (Signed)
Well-controlled.  Continue current regimen. 

## 2016-11-01 NOTE — Assessment & Plan Note (Addendum)
Peeling on hands and feet Suspect dermatitis that is atopic vs fungal  Patient denies common triggers: new medications, irritant or water exposure Will treat with kenalog cream if symptoms worsen or fail to improve will change to antifungal therapy

## 2016-11-03 LAB — HIV ANTIBODY (ROUTINE TESTING W REFLEX): HIV: NONREACTIVE

## 2016-11-04 MED FILL — !VIAGRA 100MG TABLET: 100 | 30 days supply | Qty: 2 | Fill #0

## 2016-11-07 ENCOUNTER — Telehealth: Payer: Self-pay

## 2016-11-07 NOTE — Telephone Encounter (Signed)
Pt was called and informed of lab results. 

## 2016-11-08 ENCOUNTER — Other Ambulatory Visit: Payer: Self-pay | Admitting: Family Medicine

## 2016-11-08 ENCOUNTER — Ambulatory Visit: Payer: Self-pay | Attending: Family Medicine

## 2016-11-08 DIAGNOSIS — M23203 Derangement of unspecified medial meniscus due to old tear or injury, right knee: Secondary | ICD-10-CM

## 2016-11-11 NOTE — Telephone Encounter (Signed)
Ready for pick up

## 2016-11-12 NOTE — Telephone Encounter (Signed)
Pt was called and informed of script being ready for pick up.

## 2016-11-13 ENCOUNTER — Ambulatory Visit: Payer: Self-pay | Attending: Family Medicine

## 2016-11-18 ENCOUNTER — Other Ambulatory Visit: Payer: Self-pay

## 2016-11-18 DIAGNOSIS — Z720 Tobacco use: Secondary | ICD-10-CM

## 2016-11-18 DIAGNOSIS — N529 Male erectile dysfunction, unspecified: Secondary | ICD-10-CM

## 2016-11-18 MED ORDER — SILDENAFIL CITRATE 100 MG PO TABS: 50.0000 mg | ORAL_TABLET | Freq: Every day | ORAL | 3 refills | Status: DC | PRN

## 2016-11-18 MED ORDER — ALBUTEROL SULFATE HFA 108 (90 BASE) MCG/ACT IN AERS: 2.0000 | INHALATION_SPRAY | Freq: Four times a day (QID) | RESPIRATORY_TRACT | 3 refills | Status: DC | PRN

## 2016-11-18 MED ORDER — VARENICLINE TARTRATE 1 MG PO TABS: 1.0000 mg | ORAL_TABLET | Freq: Two times a day (BID) | ORAL | 3 refills | Status: DC

## 2016-11-18 MED ORDER — VARENICLINE TARTRATE 0.5 MG X 11 & 1 MG X 42 PO MISC: ORAL | 0 refills | Status: DC

## 2016-11-18 MED ORDER — VARENICLINE TARTRATE 1 MG PO TABS: 1.0000 mg | ORAL_TABLET | Freq: Two times a day (BID) | ORAL | 2 refills | Status: DC

## 2016-11-19 MED FILL — $CHANTIX STARTING MONTH BOX: 0.5 MG X 11 | 30 days supply | Qty: 53 | Fill #0

## 2016-12-19 MED FILL — ?AMLODIPINE BESYLATE 10 MG: 10 | 30 days supply | Qty: 30 | Fill #1

## 2016-12-19 MED FILL — $VIAGRA 100 MG TABLET: 100 | 30 days supply | Qty: 10 | Fill #0

## 2017-01-01 ENCOUNTER — Encounter: Payer: Self-pay | Admitting: Family Medicine

## 2017-01-01 NOTE — Progress Notes (Signed)
Subjective:  Patient ID: Peter Friedman, male    DOB: 1966-03-18  Age: 51 y.o. MRN: [id number]  CC: Knee Pain and Back Pain   HPI Peter Friedman has HTN, smoker, erectile dysfunction he  presents for   1. Back pain: he has history of low back pain with R sided sciatica.  Previous imaging  Lumbar x-ray 06/14/16: IMPRESSION: 1. No fracture or acute finding. 2. Mild disc degenerative changes at L3-L4 and L4-L5. His current level of pain in 4/10 with intermittent spasms   2.  R Knee pain: he has history of R knee pain and swelling. He was referred to pain management by orthopaedic  after his R knee arthroscopy. Due to inconsistent UDS he was not prescribed opiod pain medicine. He was prescribed lyrica and offered genicular nerve block which he decided against. He has not returned to pain management since last visit on 12/27/15.  He reports 3-4 days of L calf pain following a popping sensation. No injury.  He reports swelling in his R knee. Pain is 5-6/10.   Previous imaging R knee x-ray 01/19/16 IMPRESSION: Mild medial joint space narrowing.  MR R knee 02/12/16 IMPRESSION: 1. Changes from prior meniscectomy of the posterior horn of the medial meniscus. 2. Partial-thickness cartilage loss of the medial femoral condyle and medial tibial plateau with subchondral reactive marrow edema in the medial tibial plateau.    [facility] [place] [place] [facility]. No unauthorized fills of controlled medications. Last filled tylenol #3 09/30/2016   3. Rash on back and chest: x one month. Papular rash. Itching.  Not tylenol #3, no new medications, no close contacts with rash. He has tried gold bond lotion that has helped with itching but did not resolve rash. He denies ETOH. He denies fever, chills.   Past Surgical History:  Procedure Laterality Date  . KNEE ARTHROSCOPY Right 02/2015     Social History  Substance Use Topics  . Smoking status: Former Smoker   Packs/day: 0.50    Types: Cigarettes  . Smokeless tobacco: Never Used  . Alcohol use No    Outpatient Medications Prior to Visit  Medication Sig Dispense Refill  . acetaminophen-codeine (TYLENOL #3) 300-30 MG tablet TAKE 1 TABLET BY MOUTH EVERY 8 HOURS AS NEEDED FOR MODERATE PAIN. 60 tablet 0  . albuterol (PROVENTIL HFA;VENTOLIN HFA) 108 (90 Base) MCG/ACT inhaler Inhale 2 puffs into the lungs every 6 (six) hours as needed for wheezing or shortness of breath. 54 g 3  . amLODipine (NORVASC) 10 MG tablet Take 1 tablet (10 mg total) by mouth daily. 30 tablet 11  . diclofenac sodium (VOLTAREN) 1 % GEL Apply 2 g topically 4 (four) times daily. 100 g 5  . pregabalin (LYRICA) 100 MG capsule Take 100 mg by mouth. Reported on 02/01/2016    . sildenafil (VIAGRA) 100 MG tablet Take 0.5-1 tablets (50-100 mg total) by mouth daily as needed for erectile dysfunction. For PASS 30 tablet 3  . triamcinolone cream (KENALOG) 0.1 % Apply 1 application topically 2 (two) times daily. 60 g 2  . varenicline (CHANTIX CONTINUING MONTH PAK) 1 MG tablet Take 1 tablet (1 mg total) by mouth 2 (two) times daily. 56 tablet 3  . varenicline (CHANTIX STARTING MONTH PAK) 0.5 MG X 11 & 1 MG X 42 tablet Taper per packet insert 53 tablet 0  . varenicline (CHANTIX STARTING MONTH PAK) 0.5 MG X 11 & 1 MG X 42 tablet Taper per packet insert 53 tablet  0   No facility-administered medications prior to visit.     ROS Review of Systems  Constitutional: Negative for chills, fatigue, fever and unexpected weight change.  Eyes: Negative for visual disturbance.  Respiratory: Negative for cough and shortness of breath.   Cardiovascular: Negative for chest pain, palpitations and leg swelling.  Gastrointestinal: Negative for abdominal pain, blood in stool, constipation, diarrhea, nausea and vomiting.  Endocrine: Negative for polydipsia, polyphagia and polyuria.  Musculoskeletal: Negative for arthralgias, back pain, gait problem, myalgias and  neck pain.  Skin: Negative for rash.  Allergic/Immunologic: Negative for immunocompromised state.  Hematological: Negative for adenopathy. Does not bruise/bleed easily.  Psychiatric/Behavioral: Negative for dysphoric mood, sleep disturbance and suicidal ideas. The patient is not nervous/anxious.     Objective:  BP 137/75   Pulse 72   Temp 97.9 F (36.6 C) (Oral)   Ht 5\' 9"  (1.753 m)   Wt 239 lb 9.6 oz (108.7 kg)   SpO2 98%   BMI 35.38 kg/m   BP/Weight 01/02/2017 11/01/2016 08/01/2016  Systolic BP 137 128 133  Diastolic BP 75 75 82  Wt. (Lbs) 239.6 236.6 242.2  BMI 35.38 34.94 35.77   Physical Exam  Constitutional: He appears well-developed and well-nourished. No distress.  HENT:  Head: Normocephalic and atraumatic.  Neck: Normal range of motion. Neck supple.  Cardiovascular: Normal rate, regular rhythm, normal heart sounds and intact distal pulses.   Pulmonary/Chest: Effort normal and breath sounds normal.  Musculoskeletal: He exhibits no edema.       Legs: Neurological: He is alert.  Skin: Skin is warm and dry. No rash noted. No erythema.     Psychiatric: He has a normal mood and affect.     Assessment & Plan:  Peter Friedman was seen today for knee pain and back pain.  Diagnoses and all orders for this visit:  Degenerative tear of medial meniscus of right knee -     Pain Mgmt, Profile 1 w/o Conf, U -     traMADol (ULTRAM) 50 MG tablet; Take 1 tablet (50 mg total) by mouth every 12 (twelve) hours as needed.  Chronic bilateral low back pain with right-sided sciatica -     Pain Mgmt, Profile 1 w/o Conf, U -     traMADol (ULTRAM) 50 MG tablet; Take 1 tablet (50 mg total) by mouth every 12 (twelve) hours as needed.  Pain of left calf -     Ambulatory referral to Sports Medicine -     traMADol (ULTRAM) 50 MG tablet; Take 1 tablet (50 mg total) by mouth every 12 (twelve) hours as needed.  Papular rash -     triamcinolone ointment (KENALOG) 0.5 %; Apply 1 application  topically 2 (two) times daily.   There are no diagnoses linked to this encounter.  No orders of the defined types were placed in this encounter.   Follow-up: Return in about 4 weeks (around 01/30/2017) for chronic pain .   Dessa Phi MD

## 2017-01-02 ENCOUNTER — Encounter: Payer: Self-pay | Admitting: Family Medicine

## 2017-01-02 ENCOUNTER — Ambulatory Visit: Payer: Self-pay | Attending: Family Medicine | Admitting: Family Medicine

## 2017-01-02 VITALS — BP 137/75 | HR 72 | Temp 97.9°F | Ht 69.0 in | Wt 239.6 lb

## 2017-01-02 DIAGNOSIS — M5441 Lumbago with sciatica, right side: Secondary | ICD-10-CM | POA: Insufficient documentation

## 2017-01-02 DIAGNOSIS — G8929 Other chronic pain: Secondary | ICD-10-CM

## 2017-01-02 DIAGNOSIS — Z79899 Other long term (current) drug therapy: Secondary | ICD-10-CM | POA: Insufficient documentation

## 2017-01-02 DIAGNOSIS — R21 Rash and other nonspecific skin eruption: Secondary | ICD-10-CM | POA: Insufficient documentation

## 2017-01-02 DIAGNOSIS — Z87891 Personal history of nicotine dependence: Secondary | ICD-10-CM | POA: Insufficient documentation

## 2017-01-02 DIAGNOSIS — X58XXXA Exposure to other specified factors, initial encounter: Secondary | ICD-10-CM | POA: Insufficient documentation

## 2017-01-02 DIAGNOSIS — M23203 Derangement of unspecified medial meniscus due to old tear or injury, right knee: Secondary | ICD-10-CM

## 2017-01-02 DIAGNOSIS — N529 Male erectile dysfunction, unspecified: Secondary | ICD-10-CM | POA: Insufficient documentation

## 2017-01-02 DIAGNOSIS — S83241A Other tear of medial meniscus, current injury, right knee, initial encounter: Secondary | ICD-10-CM | POA: Insufficient documentation

## 2017-01-02 DIAGNOSIS — M79662 Pain in left lower leg: Secondary | ICD-10-CM | POA: Insufficient documentation

## 2017-01-02 MED ORDER — TRIAMCINOLONE ACETONIDE 0.5 % EX OINT: 1.0000 "application " | TOPICAL_OINTMENT | Freq: Two times a day (BID) | CUTANEOUS | 0 refills | Status: DC

## 2017-01-02 MED ORDER — TRAMADOL HCL 50 MG PO TABS: 50.0000 mg | ORAL_TABLET | Freq: Two times a day (BID) | ORAL | 0 refills | Status: DC | PRN

## 2017-01-02 MED FILL — traMADol HCL 50 MG TABS: 50 | 30 days supply | Qty: 60 | Fill #0

## 2017-01-02 MED FILL — TRIAMCINOLONE 0.5% OINTMENT: 0.5 | 20 days supply | Qty: 30 | Fill #0

## 2017-01-02 NOTE — Assessment & Plan Note (Signed)
Suspect dermatitis from heat or allergy mediated  Plan: Topical steroid

## 2017-01-02 NOTE — Assessment & Plan Note (Addendum)
Chronic pain Refilled tramadol as tylenol #3 did not control symptoms UDS ordered

## 2017-01-02 NOTE — Assessment & Plan Note (Signed)
New onset No trauma No s/s of DVT  Possible intrinsic knee dysfunction with referred pain down calf Plan: Tramadol Sports medicine referral for [facility] ultrasound

## 2017-01-02 NOTE — Assessment & Plan Note (Addendum)
chronic pain Refilled tramadol as tylenol #3 did not control symptoms UDS ordered

## 2017-01-02 NOTE — Patient Instructions (Addendum)
Peter Friedman was seen today for knee pain and back pain.  Diagnoses and all orders for this visit:  Degenerative tear of medial meniscus of right knee -     Pain Mgmt, Profile 1 w/o Conf, U -     traMADol (ULTRAM) 50 MG tablet; Take 1 tablet (50 mg total) by mouth every 12 (twelve) hours as needed.  Chronic bilateral low back pain with right-sided sciatica -     Pain Mgmt, Profile 1 w/o Conf, U -     traMADol (ULTRAM) 50 MG tablet; Take 1 tablet (50 mg total) by mouth every 12 (twelve) hours as needed.  Pain of left calf -     Ambulatory referral to Sports Medicine -     traMADol (ULTRAM) 50 MG tablet; Take 1 tablet (50 mg total) by mouth every 12 (twelve) hours as needed.  Papular rash -     triamcinolone ointment (KENALOG) 0.5 %; Apply 1 application topically 2 (two) times daily.    F/u in 4 weeks for chronic pain/follow up tramadol  Dr. Armen PickupFunches

## 2017-01-03 LAB — DRUG SCREEN, URINE
Amphetamines, Urine: NEGATIVE ng/mL
BENZODIAZEPINE QUANT UR: NEGATIVE ng/mL
Barbiturate screen, urine: NEGATIVE ng/mL
CANNABINOID QUANT UR: NEGATIVE ng/mL
COCAINE (METAB.): NEGATIVE ng/mL
OPIATE QUANT UR: POSITIVE ng/mL
PCP QUANT UR: NEGATIVE ng/mL

## 2017-01-08 ENCOUNTER — Telehealth: Payer: Self-pay

## 2017-01-08 NOTE — Telephone Encounter (Signed)
Pt was called and informed of lab results. 

## 2017-01-10 ENCOUNTER — Encounter: Payer: Self-pay | Admitting: Family Medicine

## 2017-01-10 ENCOUNTER — Ambulatory Visit (INDEPENDENT_AMBULATORY_CARE_PROVIDER_SITE_OTHER): Payer: Self-pay | Admitting: Family Medicine

## 2017-01-10 ENCOUNTER — Ambulatory Visit
Admission: RE | Admit: 2017-01-10 | Discharge: 2017-01-10 | Disposition: A | Discharge status: Home / Self Care | Payer: No Typology Code available for payment source | Source: Ambulatory Visit | Attending: Family Medicine | Admitting: Family Medicine

## 2017-01-10 VITALS — BP 150/83 | Ht 69.0 in | Wt 240.0 lb

## 2017-01-10 DIAGNOSIS — M545 Low back pain, unspecified: Secondary | ICD-10-CM | POA: Insufficient documentation

## 2017-01-10 DIAGNOSIS — M5442 Lumbago with sciatica, left side: Secondary | ICD-10-CM

## 2017-01-10 MED ORDER — PREDNISONE 5 MG PO TABS: ORAL_TABLET | ORAL | 0 refills | Status: DC

## 2017-01-10 NOTE — Assessment & Plan Note (Addendum)
Will place on prednisone Dosepak, low-dose due to prediabetes. Peter Friedman that this can raise his blood sugar and to do good diet and exercise. Lumbar spine x-rays to reassess for arthritis or anterolisthesis Home exercise program reviewed and will consider PT in the future if needed. Discussed that he may need an MRI to assess his low back for nerve compression Will follow-up in 2 weeks to reassess his pain and if an MRI is warranted.

## 2017-01-10 NOTE — Progress Notes (Signed)
  Arbutus LeasDarin L Friedman - 51 y.o. male MRN [id number]  Date of birth: 1965/12/10  SUBJECTIVE:  Including CC & ROS.  CC: low back pain  Has had low back pain and left calf and hamstring pain for the past 2-3 weeks. It is sharp in nature. Worse with certain movements, better with rest.  It is worse when he is trying to walk straight leg. He has taken tramadol for the pain without relief.  Denies any numbness or tingling or muscle weakness. Denies any bladder or bowel dysfunction.  No known mechanism of injury.   ROS: No unexpected weight loss, fever, chills, swelling, instability, muscle pain, numbness/tingling, redness, otherwise see HPI   PMHx - Updated and reviewed.  Contributory factors include: Prediabetes, hypertension PSHx - Updated and reviewed.  Contributory factors include:  Negative FHx - Updated and reviewed.  Contributory factors include:  Negative Social Hx - Updated and reviewed. Contributory factors include: Negative Medications - reviewed   DATA REVIEWED: PCP office visit 2 view lumbar spine: L3-4, L4-5 mild DDD 06/14/17  PHYSICAL EXAM:  VS: BP:(!) 150/83  HR: bpm  TEMP: ( )  RESP:   HT:5\' 9"  (175.3 cm)   WT:240 lb (108.9 kg)  BMI:35.5 PHYSICAL EXAM: Gen: NAD, alert, cooperative with exam, well-appearing HEENT: clear conjunctiva,  CV:  no edema, capillary refill brisk, normal rate Resp: non-labored Skin: no rashes, normal turgor  Neuro: no gross deficits.  Psych:  alert and oriented Back Exam:  Inspection: Unremarkable  Palpable tenderness: None. Range of Motion:  Flexion 25 deg; Extension 25 deg; Side Bending to 25 deg to left, 35 deg right; Rotation to 45 deg bilaterally  Leg strength: Quad: 5/5 Hamstring: 5/5 Hip flexor: 5/5 Hip abductors: 4/5  Strength at foot: Plantar-flexion: 5/5 Dorsi-flexion: 5/5 Eversion: 5/5 Inversion: 5/5  Sensory change: Gross sensation intact to all lumbar and sacral dermatomes.  Reflexes: 2+ at both patellar tendons, 2+ at achilles  tendons, Babinski's downgoing.  Gait unremarkable. SLR laying: positive on left XSLR laying: positive on left FABER: negative.   ASSESSMENT & PLAN:   Acute bilateral low back pain with left-sided sciatica Will place on prednisone Dosepak, low-dose due to prediabetes. Peter Friedman that this can raise his blood sugar and to do good diet and exercise. Lumbar spine x-rays to assess for arthritis or anterolisthesis Home exercise program reviewed and will consider PT in the future if needed. Discussed that he may need an MRI to assess his low back for nerve compression Will follow-up in 2 weeks to reassess his pain and if an MRI is warranted.

## 2017-01-14 ENCOUNTER — Telehealth: Payer: Self-pay | Admitting: *Deleted

## 2017-01-14 NOTE — Telephone Encounter (Signed)
Peter Friedman This is a patient that dr Helmut MusterAlicia saw without me---can you ask her what she wants him to do. I routed the x ray results to her previously . THANKS! Denny LevySara Cailee Blanke

## 2017-01-15 NOTE — Telephone Encounter (Signed)
[place] OK--the bones look great---no sign of fracture, no sign of slippage, very minimal arthritis.  There is evidence of chronic muscle spasm--probably due to poor core strength (and protuberant belly--you may not want to mention that part) Rehab is going to be his key I would recommend he make appt w one of us [place] over his films and give him instruction in HEP THANKS! Denny LevySara Roselin Wiemann

## 2017-01-15 NOTE — Telephone Encounter (Signed)
Patient given results and will come in on 01/17/17 to view images and get HEP

## 2017-01-17 ENCOUNTER — Ambulatory Visit (INDEPENDENT_AMBULATORY_CARE_PROVIDER_SITE_OTHER): Payer: Self-pay | Admitting: Family Medicine

## 2017-01-17 ENCOUNTER — Encounter: Payer: Self-pay | Admitting: Family Medicine

## 2017-01-17 ENCOUNTER — Other Ambulatory Visit: Payer: Self-pay | Admitting: Family Medicine

## 2017-01-17 DIAGNOSIS — M545 Low back pain, unspecified: Secondary | ICD-10-CM

## 2017-01-17 DIAGNOSIS — R21 Rash and other nonspecific skin eruption: Secondary | ICD-10-CM

## 2017-01-17 MED ORDER — METHOCARBAMOL 500 MG PO TABS: 500.0000 mg | ORAL_TABLET | Freq: Two times a day (BID) | ORAL | 0 refills | Status: DC

## 2017-01-17 MED FILL — $VIAGRA 100 MG TABLET: 100 | 30 days supply | Qty: 10 | Fill #1

## 2017-01-17 MED FILL — AMLODIPINE BESYLATE 10 MG T: 10 | 30 days supply | Qty: 30 | Fill #2

## 2017-01-17 MED FILL — METHOCARBAMOL 500 MG TABLET: 500 | 30 days supply | Qty: 60 | Fill #0

## 2017-01-17 NOTE — Assessment & Plan Note (Signed)
He denies any radicular symptoms at this time and focus is the pain in lower back. Most likely the pain is associated with facet arthritis as seen on lumbar films. He likely has underlying spasm associated with this. - Added a muscle relaxer to his regimen in Robaxin - Provided home exercise therapy. - If no improvement in 4 weeks then would advise him to try a trial of physical therapy. - If no improvement could consider referral to PM&R for nerve root ablation.

## 2017-01-17 NOTE — Progress Notes (Signed)
  Peter Friedman - 51 y.o. male MRN [id number]  Date of birth: 04-Apr-1966  SUBJECTIVE:  Including CC & ROS.   Peter Friedman is a 51 year old male that is following up for low back pain. He reports the pain is staying the same. He denies any radiculopathy at this point. He has tried icy hot with minimal relief. He has also taken Aleve and tramadol. He denies any injury. The pain is worse in the morning when he is getting out of bed. He is currently not working.  ROS: No unexpected weight loss, fever, chills, swelling, instability, numbness/tingling, redness, otherwise see HPI   HISTORY: Past Medical, Surgical, Social, and Family History Reviewed & Updated per EMR.   Pertinent Historical Findings include: PMHx: Prediabetes, hypertension  Social:  Former smoker Medications: tramadol, aleve.   DATA REVIEWED: 01/10/17: lumbar x-rays: facet joint hypertrophy   PHYSICAL EXAM:  VS: BP 140/78   Ht 5\' 9"  (1.753 m)   Wt 240 lb (108.9 kg)   BMI 35.44 kg/m  PHYSICAL EXAM: Gen: NAD, alert, cooperative with exam, well-appearing HEENT: clear conjunctiva, EOMI CV:  no edema, capillary refill brisk,  Resp: non-labored, normal speech Skin: no rashes, normal turgor  Neuro: no gross deficits.  Psych:  alert and oriented MSK:  Back: Some tenderness to palpation of the paraspinal muscles in lumbar region. No tenderness to palpation over the lumbar spine, SI joints, piriformis, or greater trochanter. Normal internal and external rotation of the hips. Normal hip flexion strength. Normal knee flexion strength resistance with extension and flexion. Name negative straight leg raise bilaterally. Significant tightness with his hamstrings. Significant tightness with FABER Neurovascularly intact.   ASSESSMENT & PLAN:   Acute bilateral low back pain He denies any radicular symptoms at this time and focus is the pain in lower back. Most likely the pain is associated with facet arthritis as seen on lumbar  films. He likely has underlying spasm associated with this. - Added a muscle relaxer to his regimen in Robaxin - Provided home exercise therapy. - If no improvement in 4 weeks then would advise him to try a trial of physical therapy. - If no improvement could consider referral to PM&R for nerve root ablation.

## 2017-01-20 MED FILL — TRIAMCINOLONE 0.5% OINTMENT: 0.5 | 15 days supply | Qty: 30 | Fill #0

## 2017-01-31 ENCOUNTER — Ambulatory Visit: Payer: Self-pay | Admitting: Family Medicine

## 2017-02-03 ENCOUNTER — Encounter: Payer: Self-pay | Admitting: Family Medicine

## 2017-02-03 ENCOUNTER — Ambulatory Visit: Payer: Self-pay | Attending: Family Medicine | Admitting: Family Medicine

## 2017-02-03 VITALS — BP 148/78 | HR 70 | Temp 97.7°F | Wt 240.0 lb

## 2017-02-03 DIAGNOSIS — Z87891 Personal history of nicotine dependence: Secondary | ICD-10-CM | POA: Insufficient documentation

## 2017-02-03 DIAGNOSIS — M5441 Lumbago with sciatica, right side: Secondary | ICD-10-CM | POA: Insufficient documentation

## 2017-02-03 DIAGNOSIS — I10 Essential (primary) hypertension: Secondary | ICD-10-CM | POA: Insufficient documentation

## 2017-02-03 DIAGNOSIS — G8929 Other chronic pain: Secondary | ICD-10-CM | POA: Insufficient documentation

## 2017-02-03 DIAGNOSIS — N529 Male erectile dysfunction, unspecified: Secondary | ICD-10-CM | POA: Insufficient documentation

## 2017-02-03 DIAGNOSIS — R21 Rash and other nonspecific skin eruption: Secondary | ICD-10-CM | POA: Insufficient documentation

## 2017-02-03 DIAGNOSIS — Z0000 Encounter for general adult medical examination without abnormal findings: Secondary | ICD-10-CM | POA: Insufficient documentation

## 2017-02-03 DIAGNOSIS — Z9889 Other specified postprocedural states: Secondary | ICD-10-CM | POA: Insufficient documentation

## 2017-02-03 DIAGNOSIS — M47816 Spondylosis without myelopathy or radiculopathy, lumbar region: Secondary | ICD-10-CM

## 2017-02-03 DIAGNOSIS — M4696 Unspecified inflammatory spondylopathy, lumbar region: Secondary | ICD-10-CM | POA: Insufficient documentation

## 2017-02-03 DIAGNOSIS — M23203 Derangement of unspecified medial meniscus due to old tear or injury, right knee: Secondary | ICD-10-CM | POA: Insufficient documentation

## 2017-02-03 MED ORDER — BETAMETHASONE DIPROPIONATE 0.05 % EX OINT: TOPICAL_OINTMENT | Freq: Two times a day (BID) | CUTANEOUS | 0 refills | Status: DC

## 2017-02-03 MED ORDER — PREGABALIN 100 MG PO CAPS: 100.0000 mg | ORAL_CAPSULE | Freq: Two times a day (BID) | ORAL | 2 refills | Status: DC

## 2017-02-03 MED ORDER — TRAMADOL HCL 50 MG PO TABS: 50.0000 mg | ORAL_TABLET | Freq: Two times a day (BID) | ORAL | 2 refills | Status: DC | PRN

## 2017-02-03 MED ORDER — DICLOFENAC SODIUM 1 % TD GEL: 2.0000 g | Freq: Four times a day (QID) | TRANSDERMAL | 5 refills | Status: DC

## 2017-02-03 MED FILL — traMADol HCL 50 MG TABS: 50 | 30 days supply | Qty: 60 | Fill #0

## 2017-02-03 MED FILL — BETAMETHASONE DP 0.05% OINT: 0.05 | 15 days supply | Qty: 30 | Fill #0

## 2017-02-03 MED FILL — DICLOFENAC SODIUM 1% GEL: 1 | 25 days supply | Qty: 100 | Fill #0

## 2017-02-03 NOTE — Progress Notes (Signed)
Subjective:  Patient ID: Peter Friedman, male    DOB: 07-Sep-1965  Age: 51 y.o. MRN: [id number]  CC: Back Pain   HPI Peter Friedman has HTN, smoker, erectile dysfunction he  presents for   1. Back pain: he has history of low back pain with R sided sciatica.  Previous imaging  Lumbar x-ray 06/14/16: IMPRESSION: 1. No fracture or acute finding. 2. Mild disc degenerative changes at L3-L4 and L4-L5. His current level of pain in 4/10 with intermittent spasms. He was seen my sports medicine. He took a low dose steroid pack for radicular symptoms that resolved with steroids. Robaxin was added at f/u appt 3 weeks ago for underlying muscle spasm.  Today he reports, pain level is 5-6/10. It does fluctuate up and down. He denies pain radiating down his leg. He does have pain in his L calf.  He does work out 4 times per week.  He does water exercise 2 times per week.    2.  R Knee pain: he has history of R knee pain and swelling. He was referred to pain management by orthopaedic  after his R knee arthroscopy. Due to inconsistent UDS he was not prescribed opiod pain medicine. He was prescribed lyrica and offered genicular nerve block which he decided against. He has not returned to pain management since last visit on 12/27/15.  He reports 3-4 days of L calf pain following a popping sensation. No injury.  He reports swelling in his R knee. Pain is 5-6/10.   Previous imaging R knee x-ray 01/19/16 IMPRESSION: Mild medial joint space narrowing.  MR R knee 02/12/16 IMPRESSION: 1. Changes from prior meniscectomy of the posterior horn of the medial meniscus. 2. Partial-thickness cartilage loss of the medial femoral condyle and medial tibial plateau with subchondral reactive marrow edema in the medial tibial plateau.   3. Rash on back and chest: x two months. Papular rash. Itching.  Not tylenol #3, no new medications, no close contacts with rash. He has tried gold bond lotion that has helped with  itching but did not resolve rash. He denies ETOH. He denies fever, chills. He has tried kenalog 0.5 % ointment without significant improvement. He denies rash on hands and wrist.   4. HM: patient is now 51 years old. He is due for screening c-scope. He reports he would like to consider It.   Past Surgical History:  Procedure Laterality Date  . KNEE ARTHROSCOPY Right 02/2015     Social History  Substance Use Topics  . Smoking status: Former Smoker    Packs/day: 0.50    Types: Cigarettes  . Smokeless tobacco: Never Used  . Alcohol use No    Outpatient Medications Prior to Visit  Medication Sig Dispense Refill  . albuterol (PROVENTIL HFA;VENTOLIN HFA) 108 (90 Base) MCG/ACT inhaler Inhale 2 puffs into the lungs every 6 (six) hours as needed for wheezing or shortness of breath. 54 g 3  . amLODipine (NORVASC) 10 MG tablet Take 1 tablet (10 mg total) by mouth daily. 30 tablet 11  . diclofenac sodium (VOLTAREN) 1 % GEL Apply 2 g topically 4 (four) times daily. 100 g 5  . methocarbamol (ROBAXIN) 500 MG tablet Take 1 tablet (500 mg total) by mouth 2 (two) times daily. 60 tablet 0  . predniSONE (DELTASONE) 5 MG tablet Take as directed per MD 48 tablet 0  . pregabalin (LYRICA) 100 MG capsule Take 100 mg by mouth. Reported on 02/01/2016    . sildenafil (  VIAGRA) 100 MG tablet Take 0.5-1 tablets (50-100 mg total) by mouth daily as needed for erectile dysfunction. For PASS 30 tablet 3  . traMADol (ULTRAM) 50 MG tablet Take 1 tablet (50 mg total) by mouth every 12 (twelve) hours as needed. 60 tablet 0  . triamcinolone ointment (KENALOG) 0.5 % APPLY 1 APPLICATION TOPICALLY 2 TIMES DAILY. 30 g 0  . varenicline (CHANTIX CONTINUING MONTH PAK) 1 MG tablet Take 1 tablet (1 mg total) by mouth 2 (two) times daily. 56 tablet 3  . varenicline (CHANTIX STARTING MONTH PAK) 0.5 MG X 11 & 1 MG X 42 tablet Taper per packet insert 53 tablet 0  . varenicline (CHANTIX STARTING MONTH PAK) 0.5 MG X 11 & 1 MG X 42 tablet  Taper per packet insert 53 tablet 0   No facility-administered medications prior to visit.     ROS Review of Systems  Constitutional: Negative for chills, fatigue, fever and unexpected weight change.  Eyes: Negative for visual disturbance.  Respiratory: Negative for cough and shortness of breath.   Cardiovascular: Negative for chest pain, palpitations and leg swelling.  Gastrointestinal: Negative for abdominal pain, blood in stool, constipation, diarrhea, nausea and vomiting.  Endocrine: Negative for polydipsia, polyphagia and polyuria.  Musculoskeletal: Negative for arthralgias, back pain, gait problem, myalgias and neck pain.  Skin: Negative for rash.  Allergic/Immunologic: Negative for immunocompromised state.  Hematological: Negative for adenopathy. Does not bruise/bleed easily.  Psychiatric/Behavioral: Negative for dysphoric mood, sleep disturbance and suicidal ideas. The patient is not nervous/anxious.     Objective:  BP (!) 148/78   Pulse 70   Temp 97.7 F (36.5 C) (Oral)   Wt 240 lb (108.9 kg)   SpO2 97%   BMI 35.44 kg/m   BP/Weight 02/03/2017 01/17/2017 01/10/2017  Systolic BP 148 140 150  Diastolic BP 78 78 83  Wt. (Lbs) 240 240 240  BMI 35.44 35.44 35.44   Physical Exam  Constitutional: He appears well-developed and well-nourished. No distress.  HENT:  Head: Normocephalic and atraumatic.  Neck: Normal range of motion. Neck supple.  Cardiovascular: Normal rate, regular rhythm, normal heart sounds and intact distal pulses.   Pulmonary/Chest: Effort normal and breath sounds normal.  Musculoskeletal: He exhibits no edema.       Legs: Neurological: He is alert.  Skin: Skin is warm and dry. No rash noted. No erythema.     Psychiatric: He has a normal mood and affect.     Assessment & Plan:  Peter Friedman was seen today for back pain.  Diagnoses and all orders for this visit:  Papular rash -     betamethasone dipropionate (DIPROLENE) 0.05 % ointment; Apply topically  2 (two) times daily.  Degenerative tear of medial meniscus of right knee -     traMADol (ULTRAM) 50 MG tablet; Take 1 tablet (50 mg total) by mouth every 12 (twelve) hours as needed. -     diclofenac sodium (VOLTAREN) 1 % GEL; Apply 2 g topically 4 (four) times daily.  Chronic bilateral low back pain with right-sided sciatica -     traMADol (ULTRAM) 50 MG tablet; Take 1 tablet (50 mg total) by mouth every 12 (twelve) hours as needed. -     diclofenac sodium (VOLTAREN) 1 % GEL; Apply 2 g topically 4 (four) times daily. -     pregabalin (LYRICA) 100 MG capsule; Take 1 capsule (100 mg total) by mouth 2 (two) times daily. Reported on 02/01/2016  Facet arthritis of lumbar region [facility](HCC) -  traMADol (ULTRAM) 50 MG tablet; Take 1 tablet (50 mg total) by mouth every 12 (twelve) hours as needed. -     diclofenac sodium (VOLTAREN) 1 % GEL; Apply 2 g topically 4 (four) times daily.  Healthcare maintenance -     Cancel: Ambulatory referral to Gastroenterology   There are no diagnoses linked to this encounter.  No orders of the defined types were placed in this encounter.   Follow-up: Return in about 7 weeks (around 03/24/2017) for f/u chronic knee and back pain .   Dessa Phi MD

## 2017-02-03 NOTE — Assessment & Plan Note (Signed)
Facet arthritis in lumbar spine causing chronic pain Plan: Continue robaxin voltaren gel Tramadol UDS ordered at last OV was negative for unprescribed medications

## 2017-02-03 NOTE — Patient Instructions (Addendum)
Peter Friedman was seen today for back pain.  Diagnoses and all orders for this visit:  Papular rash -     betamethasone dipropionate (DIPROLENE) 0.05 % ointment; Apply topically 2 (two) times daily.  Degenerative tear of medial meniscus of right knee -     traMADol (ULTRAM) 50 MG tablet; Take 1 tablet (50 mg total) by mouth every 12 (twelve) hours as needed. -     diclofenac sodium (VOLTAREN) 1 % GEL; Apply 2 g topically 4 (four) times daily.  Chronic bilateral low back pain with right-sided sciatica -     traMADol (ULTRAM) 50 MG tablet; Take 1 tablet (50 mg total) by mouth every 12 (twelve) hours as needed. -     diclofenac sodium (VOLTAREN) 1 % GEL; Apply 2 g topically 4 (four) times daily. -     pregabalin (LYRICA) 100 MG capsule; Take 1 capsule (100 mg total) by mouth 2 (two) times daily. Reported on 02/01/2016  Facet arthritis of lumbar region (HCC) -     traMADol (ULTRAM) 50 MG tablet; Take 1 tablet (50 mg total) by mouth every 12 (twelve) hours as needed. -     diclofenac sodium (VOLTAREN) 1 % GEL; Apply 2 g topically 4 (four) times daily.  Healthcare maintenance -     Ambulatory referral to Gastroenterology   F/u in 7 weeks for chronic knee and back pain   Dr. Armen [employer]ches   Low-Sodium Eating Plan Sodium, which is an element that makes up salt, helps you maintain a healthy balance of fluids in your body. Too much sodium can increase your blood pressure and cause fluid and waste to be held in your body. Your health care provider or dietitian may recommend following this plan if you have high blood pressure (hypertension), kidney disease, liver disease, or heart failure. Eating less sodium can help lower your blood pressure, reduce swelling, and protect your heart, liver, and kidneys. What are tips for following this plan? General guidelines  Most people on this plan should limit their sodium intake to 1,500-2,000 mg (milligrams) of sodium each day. Reading food labels  The Nutrition  Facts label lists the amount of sodium in one serving of the food. If you eat more than one serving, you must multiply the listed amount of sodium by the number of servings.  Choose foods with less than 140 mg of sodium per serving.  Avoid foods with 300 mg of sodium or more per serving. Shopping  Look for lower-sodium products, often labeled as "low-sodium" or "no salt added."  Always check the sodium content even if foods are labeled as "unsalted" or "no salt added".  Buy fresh foods. ? Avoid canned foods and premade or frozen meals. ? Avoid canned, cured, or processed meats  Buy breads that have less than 80 mg of sodium per slice. Cooking  Eat more home-cooked food and less restaurant, buffet, and fast food.  Avoid adding salt when cooking. Use salt-free seasonings or herbs instead of table salt or sea salt. Check with your health care provider or pharmacist before using salt substitutes.  Cook with plant-based oils, such as canola, sunflower, or olive oil. Meal planning  When eating at a restaurant, ask that your food be prepared with less salt or no salt, if possible.  Avoid foods that contain MSG (monosodium glutamate). MSG is sometimes added to [place] food, bouillon, and some canned foods. What foods are recommended? The items listed may not be a complete list. Talk with your dietitian about  what dietary choices are best for you. Grains Low-sodium cereals, including oats, puffed wheat and rice, and shredded wheat. Low-sodium crackers. Unsalted rice. Unsalted pasta. Low-sodium bread. Whole-grain breads and whole-grain pasta. Vegetables Fresh or frozen vegetables. "No salt added" canned vegetables. "No salt added" tomato sauce and paste. Low-sodium or reduced-sodium tomato and vegetable juice. Fruits Fresh, frozen, or canned fruit. Fruit juice. Meats and other protein foods Fresh or frozen (no salt added) meat, poultry, seafood, and fish. Low-sodium canned tuna and salmon.  Unsalted nuts. Dried peas, beans, and lentils without added salt. Unsalted canned beans. Eggs. Unsalted nut butters. Dairy Milk. Soy milk. Cheese that is naturally low in sodium, such as ricotta cheese, fresh mozzarella, or Swiss cheese Low-sodium or reduced-sodium cheese. Cream cheese. Yogurt. Fats and oils Unsalted butter. Unsalted margarine with no trans fat. Vegetable oils such as canola or olive oils. Seasonings and other foods Fresh and dried herbs and spices. Salt-free seasonings. Low-sodium mustard and ketchup. Sodium-free salad dressing. Sodium-free light mayonnaise. Fresh or refrigerated horseradish. Lemon juice. Vinegar. Homemade, reduced-sodium, or low-sodium soups. Unsalted popcorn and pretzels. Low-salt or salt-free chips. What foods are not recommended? The items listed may not be a complete list. Talk with your dietitian about what dietary choices are best for you. Grains Instant hot cereals. Bread stuffing, pancake, and biscuit mixes. Croutons. Seasoned rice or pasta mixes. Noodle soup cups. Boxed or frozen macaroni and cheese. Regular salted crackers. Self-rising flour. Vegetables Sauerkraut, pickled vegetables, and relishes. Olives. [place] fries. Onion rings. Regular canned vegetables (not low-sodium or reduced-sodium). Regular canned tomato sauce and paste (not low-sodium or reduced-sodium). Regular tomato and vegetable juice (not low-sodium or reduced-sodium). Frozen vegetables in sauces. Meats and other protein foods Meat or fish that is salted, canned, smoked, spiced, or pickled. Bacon, ham, sausage, hotdogs, corned beef, chipped beef, packaged lunch meats, salt pork, jerky, pickled herring, anchovies, regular canned tuna, sardines, salted nuts. Dairy Processed cheese and cheese spreads. Cheese curds. Blue cheese. Feta cheese. String cheese. Regular cottage cheese. Buttermilk. Canned milk. Fats and oils Salted butter. Regular margarine. Ghee. Bacon fat. Seasonings and other  foods Onion salt, garlic salt, seasoned salt, table salt, and sea salt. Canned and packaged gravies. Worcestershire sauce. Tartar sauce. Barbecue sauce. Teriyaki sauce. Soy sauce, including reduced-sodium. Steak sauce. Fish sauce. Oyster sauce. Cocktail sauce. Horseradish that you find on the shelf. Regular ketchup and mustard. Meat flavorings and tenderizers. Bouillon cubes. Hot sauce and Tabasco sauce. Premade or packaged marinades. Premade or packaged taco seasonings. Relishes. Regular salad dressings. Salsa. Potato and tortilla chips. Corn chips and puffs. Salted popcorn and pretzels. Canned or dried soups. Pizza. Frozen entrees and pot pies. Summary  Eating less sodium can help lower your blood pressure, reduce swelling, and protect your heart, liver, and kidneys.  Most people on this plan should limit their sodium intake to 1,500-2,000 mg (milligrams) of sodium each day.  Canned, boxed, and frozen foods are high in sodium. Restaurant foods, fast foods, and pizza are also very high in sodium. You also get sodium by adding salt to food.  Try to cook at home, eat more fresh fruits and vegetables, and eat less fast food, canned, processed, or prepared foods. This information is not intended to replace advice given to you by your health care provider. Make sure you discuss any questions you have with your health care provider. Document Released: 01/25/2002 Document Revised: 07/29/2016 Document Reviewed: 07/29/2016 Elsevier Interactive Patient Education  2017 [employer].

## 2017-02-18 ENCOUNTER — Ambulatory Visit: Payer: Self-pay | Admitting: Sports Medicine

## 2017-02-25 MED FILL — AMLODIPINE BESYLATE 10 MG T: 10 | 90 days supply | Qty: 90 | Fill #3

## 2017-02-25 MED FILL — $VIAGRA 100 MG TABLET: 100 | 30 days supply | Qty: 10 | Fill #2

## 2017-02-27 ENCOUNTER — Encounter: Payer: Self-pay | Admitting: Sports Medicine

## 2017-02-27 ENCOUNTER — Ambulatory Visit (INDEPENDENT_AMBULATORY_CARE_PROVIDER_SITE_OTHER): Payer: Self-pay | Admitting: Sports Medicine

## 2017-02-27 VITALS — BP 140/80 | Ht 69.0 in | Wt 240.0 lb

## 2017-02-27 DIAGNOSIS — M545 Low back pain: Secondary | ICD-10-CM

## 2017-02-27 DIAGNOSIS — M23203 Derangement of unspecified medial meniscus due to old tear or injury, right knee: Secondary | ICD-10-CM

## 2017-02-28 NOTE — Progress Notes (Signed)
   Subjective:    Patient ID: Peter Friedman, male    DOB: 18-Feb-1966, 51 y.o.   MRN: [id number]  HPI   Patient comes in today for follow-up on low back pain. Pain has improved but not completely resolved. X-rays of his lumbar spine show some mild degenerative changes at L3-L4 and L4-L5 but nothing severe. His symptoms seem to be worse with prolonged sitting. He also has pain with walking. In addition to his pain he will sometimes get bilateral calf tightness, left greater than right. He was prescribed some tramadol which has been effective (his PCP provided this for him). He was also given a prescription for Robaxin but it made him sleepy. He denies radiating pain in his legs. No change in bowel or bladder.    Review of Systems    as above Objective:   Physical Exam  Well-developed, well-nourished. No acute distress. Sitting comfortably in the exam room. Vital signs reviewed  Lumbar spine: Good lumbar range of motion. Mildly reproducible pain with forward flexion. No tenderness to palpation along the lumbar midline. No spasm. Negative straight leg raise bilaterally. Strength is 5/5 in both lower extremities. Reflexes are trace but equal at the Achilles and patellar tendons bilaterally. No atrophy. Sensation is intact to light touch grossly.      Assessment & Plan:   Low back pain likely secondary to mild degenerative disc disease  80 mg IM Depo-Medrol injection. Continue with tramadol as needed (he is currently getting this from his PCP). I think tramadol is safe for him to take chronically. He is definitely taking it only as needed. I've given him a couple of McKenzie extension exercises to start doing at home and he will follow-up with me in 4-6 weeks for reevaluation. If symptoms persist or worsen we may need to consider merits of further diagnostic imaging. Call with questions or concerns prior to his follow-up visit.

## 2017-03-25 ENCOUNTER — Ambulatory Visit: Payer: No Typology Code available for payment source | Attending: Family Medicine | Admitting: Family Medicine

## 2017-03-25 ENCOUNTER — Encounter: Payer: Self-pay | Admitting: Family Medicine

## 2017-03-25 VITALS — BP 130/83 | HR 77 | Temp 98.5°F | Ht 69.0 in | Wt 236.0 lb

## 2017-03-25 DIAGNOSIS — G8929 Other chronic pain: Secondary | ICD-10-CM | POA: Insufficient documentation

## 2017-03-25 DIAGNOSIS — Z9889 Other specified postprocedural states: Secondary | ICD-10-CM | POA: Insufficient documentation

## 2017-03-25 DIAGNOSIS — Z720 Tobacco use: Secondary | ICD-10-CM

## 2017-03-25 DIAGNOSIS — F17210 Nicotine dependence, cigarettes, uncomplicated: Secondary | ICD-10-CM | POA: Insufficient documentation

## 2017-03-25 DIAGNOSIS — N529 Male erectile dysfunction, unspecified: Secondary | ICD-10-CM | POA: Insufficient documentation

## 2017-03-25 DIAGNOSIS — M47816 Spondylosis without myelopathy or radiculopathy, lumbar region: Secondary | ICD-10-CM

## 2017-03-25 DIAGNOSIS — R21 Rash and other nonspecific skin eruption: Secondary | ICD-10-CM

## 2017-03-25 DIAGNOSIS — Z1211 Encounter for screening for malignant neoplasm of colon: Secondary | ICD-10-CM | POA: Insufficient documentation

## 2017-03-25 DIAGNOSIS — I10 Essential (primary) hypertension: Secondary | ICD-10-CM | POA: Insufficient documentation

## 2017-03-25 DIAGNOSIS — M5441 Lumbago with sciatica, right side: Secondary | ICD-10-CM | POA: Insufficient documentation

## 2017-03-25 DIAGNOSIS — M4696 Unspecified inflammatory spondylopathy, lumbar region: Secondary | ICD-10-CM | POA: Insufficient documentation

## 2017-03-25 DIAGNOSIS — M23203 Derangement of unspecified medial meniscus due to old tear or injury, right knee: Secondary | ICD-10-CM

## 2017-03-25 MED ORDER — TRAMADOL HCL 50 MG PO TABS: 50.0000 mg | ORAL_TABLET | Freq: Two times a day (BID) | ORAL | 2 refills | Status: DC | PRN

## 2017-03-25 MED ORDER — ALBUTEROL SULFATE HFA 108 (90 BASE) MCG/ACT IN AERS: 2.0000 | INHALATION_SPRAY | Freq: Four times a day (QID) | RESPIRATORY_TRACT | 3 refills | Status: AC | PRN

## 2017-03-25 MED FILL — $VENTOLIN HFA 18G INHALER: 108 (90 BAS | 25 days supply | Qty: 18 | Fill #0

## 2017-03-25 MED FILL — traMADol HCL 50 MG TABS: 50 | 30 days supply | Qty: 60 | Fill #0

## 2017-03-25 NOTE — Assessment & Plan Note (Signed)
Improving Continue diprolene ointment

## 2017-03-25 NOTE — Patient Instructions (Addendum)
Peter Friedman was seen today for knee pain.  Diagnoses and all orders for this visit:  Colon cancer screening -     Fecal occult blood, imunochemical  Degenerative tear of medial meniscus of right knee -     traMADol (ULTRAM) 50 MG tablet; Take 1 tablet (50 mg total) by mouth every 12 (twelve) hours as needed.  Chronic bilateral low back pain with right-sided sciatica -     traMADol (ULTRAM) 50 MG tablet; Take 1 tablet (50 mg total) by mouth every 12 (twelve) hours as needed.  Facet arthritis of lumbar region ([place]) -     traMADol (ULTRAM) 50 MG tablet; Take 1 tablet (50 mg total) by mouth every 12 (twelve) hours as needed.  Tobacco abuse -     albuterol (PROVENTIL HFA;VENTOLIN HFA) 108 (90 Base) MCG/ACT inhaler; Inhale 2 puffs into the lungs every 6 (six) hours as needed for wheezing or shortness of breath.  For free colon cancer screening: Please complete home stool specimen collection and mail the kit. This is free screening.  You will receive at $10 gift card once the results are obtained.   Your summary for your disability application will be mailed to you.  F/u in 3 months for chronic pain and HTN. You may return sooner for the flu shot starting in September 2018.  Dr. Adrian Blackwater

## 2017-03-25 NOTE — Assessment & Plan Note (Signed)
Chronic pain in low back and knee. Knee pain is worse  Plan: Continue tramadol, lyrica, diclofenac gel and robaxin He is applying for disability  He is not work and unable to pay child support He request letters of support

## 2017-03-25 NOTE — Progress Notes (Signed)
Subjective:  Patient ID: Peter Friedman, male    DOB: 01-03-1966  Age: 51 y.o. MRN: [id number]  CC: Knee Pain   HPI Elis L Ammar has HTN, smoker, erectile dysfunction he  presents for   1. Back pain: he has history of low back pain with R sided sciatica.  Previous imaging  Lumbar x-ray 06/14/16: IMPRESSION: 1. No fracture or acute finding. 2. Mild disc degenerative changes at L3-L4 and L4-L5. His current level of pain in 4/10 with intermittent spasms. He was seen my sports medicine. He took a low dose steroid pack for radicular symptoms that resolved with steroids. Robaxin was added at f/u appt 3 weeks ago for underlying muscle spasm.  Today he reports, pain level is 5-6/10. It does fluctuate up and down. He denies pain radiating down his leg. He does have pain in his L calf.  He does work out 4 times per week.  He does water exercise 2 times per week.   2.  R Knee pain: he has history of R knee pain and swelling. He was referred to pain management by orthopaedic  after his R knee arthroscopy. Due to inconsistent UDS he was not prescribed opiod pain medicine. He was prescribed lyrica and offered genicular nerve block which he decided against. He has not returned to pain management since last visit on 12/27/15.  He pain level of 7/10.   Previous imaging R knee x-ray 01/19/16 IMPRESSION: Mild medial joint space narrowing.  MR R knee 02/12/16 IMPRESSION: 1. Changes from prior meniscectomy of the posterior horn of the medial meniscus. 2. Partial-thickness cartilage loss of the medial femoral condyle and medial tibial plateau with subchondral reactive marrow edema in the medial tibial plateau.  3. Rash on back and chest: improving with diprolene. No longer pruritic.   4. Smoker: he smokes a few puffs from 1 cigarette a day. He is compliant with chantix and sucks on hard candies to prevent cravings.   Past Surgical History:  Procedure Laterality Date  . KNEE ARTHROSCOPY Right  02/2015     Social History  Substance Use Topics  . Smoking status: Former Smoker    Packs/day: 0.50    Types: Cigarettes  . Smokeless tobacco: Never Used  . Alcohol use No    Outpatient Medications Prior to Visit  Medication Sig Dispense Refill  . albuterol (PROVENTIL HFA;VENTOLIN HFA) 108 (90 Base) MCG/ACT inhaler Inhale 2 puffs into the lungs every 6 (six) hours as needed for wheezing or shortness of breath. 54 g 3  . amLODipine (NORVASC) 10 MG tablet Take 1 tablet (10 mg total) by mouth daily. 30 tablet 11  . betamethasone dipropionate (DIPROLENE) 0.05 % ointment Apply topically 2 (two) times daily. 30 g 0  . diclofenac sodium (VOLTAREN) 1 % GEL Apply 2 g topically 4 (four) times daily. 100 g 5  . methocarbamol (ROBAXIN) 500 MG tablet Take 1 tablet (500 mg total) by mouth 2 (two) times daily. 60 tablet 0  . pregabalin (LYRICA) 100 MG capsule Take 1 capsule (100 mg total) by mouth 2 (two) times daily. Reported on 02/01/2016 60 capsule 2  . sildenafil (VIAGRA) 100 MG tablet Take 0.5-1 tablets (50-100 mg total) by mouth daily as needed for erectile dysfunction. For PASS 30 tablet 3  . traMADol (ULTRAM) 50 MG tablet Take 1 tablet (50 mg total) by mouth every 12 (twelve) hours as needed. 60 tablet 2  . varenicline (CHANTIX CONTINUING MONTH PAK) 1 MG tablet Take 1 tablet (1  mg total) by mouth 2 (two) times daily. 56 tablet 3   No facility-administered medications prior to visit.     ROS Review of Systems  Constitutional: Negative for chills, fatigue, fever and unexpected weight change.  Eyes: Negative for visual disturbance.  Respiratory: Negative for cough and shortness of breath.   Cardiovascular: Negative for chest pain, palpitations and leg swelling.  Gastrointestinal: Negative for abdominal pain, blood in stool, constipation, diarrhea, nausea and vomiting.  Endocrine: Negative for polydipsia, polyphagia and polyuria.  Musculoskeletal: Positive for arthralgias, back pain, gait  problem and joint swelling. Negative for myalgias and neck pain.  Skin: Positive for rash.  Allergic/Immunologic: Negative for immunocompromised state.  Hematological: Negative for adenopathy. Does not bruise/bleed easily.  Psychiatric/Behavioral: Negative for dysphoric mood, sleep disturbance and suicidal ideas. The patient is not nervous/anxious.     Objective:  BP 130/83   Pulse 77   Temp 98.5 F (36.9 C) (Oral)   Ht 5\' 9"  (1.753 m)   Wt 236 lb (107 kg)   SpO2 99%   BMI 34.85 kg/m   BP/Weight 03/25/2017 02/27/2017 02/03/2017  Systolic BP 130 140 148  Diastolic BP 83 80 78  Wt. (Lbs) 236 240 240  BMI 34.85 35.44 35.44   Physical Exam  Constitutional: He appears well-developed and well-nourished. No distress.  HENT:  Head: Normocephalic and atraumatic.  Neck: Normal range of motion. Neck supple.  Cardiovascular: Normal rate, regular rhythm, normal heart sounds and intact distal pulses.   Pulmonary/Chest: Effort normal and breath sounds normal.  Musculoskeletal: He exhibits no edema.       Legs: Neurological: He is alert.  Skin: Skin is warm and dry. No rash noted. No erythema.     Psychiatric: He has a normal mood and affect.     Assessment & Plan:  [place] was seen today for knee pain.  Diagnoses and all orders for this visit:  Colon cancer screening -     Fecal occult blood, imunochemical  Degenerative tear of medial meniscus of right knee -     traMADol (ULTRAM) 50 MG tablet; Take 1 tablet (50 mg total) by mouth every 12 (twelve) hours as needed.  Chronic bilateral low back pain with right-sided sciatica -     traMADol (ULTRAM) 50 MG tablet; Take 1 tablet (50 mg total) by mouth every 12 (twelve) hours as needed.  Facet arthritis of lumbar region (HCC) -     traMADol (ULTRAM) 50 MG tablet; Take 1 tablet (50 mg total) by mouth every 12 (twelve) hours as needed.  Tobacco abuse -     albuterol (PROVENTIL HFA;VENTOLIN HFA) 108 (90 Base) MCG/ACT inhaler; Inhale 2  puffs into the lungs every 6 (six) hours as needed for wheezing or shortness of breath.   There are no diagnoses linked to this encounter.  No orders of the defined types were placed in this encounter.   Follow-up: Return in about 3 months (around 06/25/2017) for HTN and chronic pain .   Dessa Phi MD

## 2017-04-02 ENCOUNTER — Ambulatory Visit: Payer: Self-pay | Attending: Internal Medicine

## 2017-04-10 ENCOUNTER — Ambulatory Visit: Payer: Self-pay | Admitting: Sports Medicine

## 2017-04-29 ENCOUNTER — Encounter (INDEPENDENT_AMBULATORY_CARE_PROVIDER_SITE_OTHER): Payer: Self-pay

## 2017-04-29 ENCOUNTER — Ambulatory Visit (INDEPENDENT_AMBULATORY_CARE_PROVIDER_SITE_OTHER): Payer: Self-pay | Admitting: Sports Medicine

## 2017-04-29 VITALS — BP 128/82 | Ht 69.0 in | Wt 240.0 lb

## 2017-04-29 DIAGNOSIS — M23203 Derangement of unspecified medial meniscus due to old tear or injury, right knee: Secondary | ICD-10-CM

## 2017-04-29 DIAGNOSIS — M545 Low back pain: Secondary | ICD-10-CM

## 2017-04-29 NOTE — Progress Notes (Signed)
   [facility] [address] [place], [place] [address] Phone: [phone] Fax: [phone]   Patient Name: Peter Friedman Date of Birth: [birth year] Medical Record Number: [id number] Gender: male Date of Encounter: 04/29/2017  History of Present Illness:  Peter Friedman is a 51 y.o. very pleasant male patient who is an established patient with the clinic who presents for follow up for back pain and for evaluation of right knee pain. XRs of his L-spine show some disc space narrowing at L3-L4 with anterior osteophyte formation at L4-L5.  Was recommended to complete mackenzie exercises 2-3/week, but stopped couple weeks ago.  Does [facility] to [facility] and is doing water exercises. Does not think tramadol is helping.  His knee pain is chronic and had a knee arthroscopy in 7/16 and then briefly was seen by pain management. Was offered genicular nerve block, which he denied. Previously has had steroid injection on 03/04/16.   ROS: denies fevers, chills, numbness, tingling or weakness, locking, popping, clicking, does have feelings of instability.    Past Medical, Surgical, Social, and Family History Reviewed. Medications and Allergies reviewed and all updated if necessary.  Review of Systems:  See history of present illness  Physical Examination: Vitals:   04/29/17 0857  BP: 128/82   Vitals:   04/29/17 0857  Weight: 240 lb (108.9 kg)  Height: 5\' 9"  (1.753 m)   Body mass index is 35.44 kg/m.  General: well appearing 51 year old male in NAD Cardiac: well perfused Resp: NWOB MSK:  Back: No gross deformities or edema. Trunk with full range of motion, however does note discomfort with flexion and extension. Minimal tenderness to palpation along spinous L5-S1 as well as paraspinal bilaterally.  Knee: No gross deformities or edema. Limited knee flexion on R side with quad weakness. Medial and lateral joint line tenderness, positive Thessaly and  McMurray. No varus valgus laxity. Negative anterior posterior drawer sign and Lachman.  Neuro: grossly normal, no changes in sensation, strength 5 out of 5 throughout  Assessment and Plan: Right knee pain, chronic Exam significant for limited knee flexion on the right side with strengthening weakness as well as medial lateral joint line tenderness and positive Thessaly and McMurray on the right side. Most recent imaging performed 01/19/2016 and at that time noted mild medial joint space narrowing. We'll follow-up with repeat imaging to assess for further degeneration.  - Follow-up right knee x-ray - rx for physical therapy for quadriceps strengthening - Follow-up in 4-5 weeks  Chronic lower back pain No further limitations in range of motion, worsening pain or radicular symptoms. Pain limited to trunk flexion and when sitting down, which likely represents degenerative disc disease. - physical therapy - Follow-up in 4-5 weeks - Return precautions discussed patient  Peter BoomDaniel L. Myrtie SomanWarden, MD [facility] Resident PGY-2 04/29/2017 12:22 PM   Patient seen and evaluated with the resident. Agree with the above plan of care. We will start with getting updated imaging of the right knee. We did discuss the possibility of cortisone injection at follow-up if still some demand. For his low back pain I think he would benefit from physical therapy. He can wean to home exercise program per the therapist's discretion. We will reevaluate this at follow-up in 4-5 weeks.

## 2017-04-29 NOTE — Patient Instructions (Signed)
Peter Friedman, you were seen today for follow-up for low back pain and right knee pain. You have not had any imaging done of your knees in quite some time so I'm placing an order for some x-rays. [facility].  Also continue using your voltaren gel as needed and continuing your exercises.   Your continued back pain is likely due to your arthritis. It is reassuring that you're not having any numbness or tingling or weakness in your legs.  Best option at this point is to refer to physical therapy. We would like you to continue physical therapy 1-2 times per week to learn some exercises to continue at home.   Please follow-up in 4-5 weeks  Latara Micheli L. Myrtie SomanWarden, MD [facility] Resident PGY-2 04/29/2017 9:38 AM

## 2017-05-01 MED FILL — traMADol HCL 50 MG TABS: 50 | 30 days supply | Qty: 60 | Fill #1

## 2017-05-01 MED FILL — $VIAGRA 100 MG TABLET: 100 | 30 days supply | Qty: 10 | Fill #3

## 2017-05-07 ENCOUNTER — Ambulatory Visit
Admission: RE | Admit: 2017-05-07 | Discharge: 2017-05-07 | Disposition: A | Discharge status: Home / Self Care | Payer: No Typology Code available for payment source | Source: Ambulatory Visit | Attending: Sports Medicine | Admitting: Sports Medicine

## 2017-05-07 ENCOUNTER — Ambulatory Visit: Payer: Self-pay | Attending: Internal Medicine

## 2017-05-07 ENCOUNTER — Other Ambulatory Visit: Payer: Self-pay

## 2017-05-07 DIAGNOSIS — M23203 Derangement of unspecified medial meniscus due to old tear or injury, right knee: Secondary | ICD-10-CM

## 2017-05-07 NOTE — Addendum Note (Signed)
Addended by: Rutha Bouchard E on: 05/07/2017 10:13 AM   Modules accepted: Orders

## 2017-06-02 MED FILL — traMADol HCL 50 MG TABS: 50 | 30 days supply | Qty: 60 | Fill #2

## 2017-06-02 MED FILL — AMLODIPINE BESYLATE 10 MG T: 10 | 90 days supply | Qty: 90 | Fill #4

## 2017-06-09 ENCOUNTER — Ambulatory Visit: Payer: Self-pay | Admitting: Sports Medicine

## 2017-06-25 ENCOUNTER — Ambulatory Visit: Payer: Self-pay | Admitting: Family Medicine

## 2017-06-26 ENCOUNTER — Ambulatory Visit: Payer: Self-pay | Attending: Family Medicine | Admitting: Internal Medicine

## 2017-06-26 ENCOUNTER — Encounter: Payer: Self-pay | Admitting: Internal Medicine

## 2017-06-26 VITALS — BP 132/74 | HR 70 | Temp 98.4°F | Resp 18 | Ht 69.0 in | Wt 237.4 lb

## 2017-06-26 DIAGNOSIS — Z720 Tobacco use: Secondary | ICD-10-CM

## 2017-06-26 DIAGNOSIS — I10 Essential (primary) hypertension: Secondary | ICD-10-CM

## 2017-06-26 DIAGNOSIS — M545 Low back pain, unspecified: Secondary | ICD-10-CM

## 2017-06-26 DIAGNOSIS — Z87891 Personal history of nicotine dependence: Secondary | ICD-10-CM | POA: Insufficient documentation

## 2017-06-26 DIAGNOSIS — R7303 Prediabetes: Secondary | ICD-10-CM | POA: Insufficient documentation

## 2017-06-26 DIAGNOSIS — X58XXXA Exposure to other specified factors, initial encounter: Secondary | ICD-10-CM | POA: Insufficient documentation

## 2017-06-26 DIAGNOSIS — N529 Male erectile dysfunction, unspecified: Secondary | ICD-10-CM | POA: Insufficient documentation

## 2017-06-26 DIAGNOSIS — Z1331 Encounter for screening for depression: Secondary | ICD-10-CM

## 2017-06-26 DIAGNOSIS — Z1211 Encounter for screening for malignant neoplasm of colon: Secondary | ICD-10-CM

## 2017-06-26 DIAGNOSIS — G8929 Other chronic pain: Secondary | ICD-10-CM

## 2017-06-26 DIAGNOSIS — S83241A Other tear of medial meniscus, current injury, right knee, initial encounter: Secondary | ICD-10-CM | POA: Insufficient documentation

## 2017-06-26 DIAGNOSIS — Z79899 Other long term (current) drug therapy: Secondary | ICD-10-CM | POA: Insufficient documentation

## 2017-06-26 DIAGNOSIS — F4320 Adjustment disorder, unspecified: Secondary | ICD-10-CM | POA: Insufficient documentation

## 2017-06-26 DIAGNOSIS — M25561 Pain in right knee: Secondary | ICD-10-CM

## 2017-06-26 DIAGNOSIS — M722 Plantar fascial fibromatosis: Secondary | ICD-10-CM | POA: Insufficient documentation

## 2017-06-26 MED ORDER — TRAMADOL HCL 50 MG PO TABS: 50.0000 mg | ORAL_TABLET | Freq: Two times a day (BID) | ORAL | 2 refills | Status: DC | PRN

## 2017-06-26 MED ORDER — AMLODIPINE BESYLATE 10 MG PO TABS: 10.0000 mg | ORAL_TABLET | Freq: Every day | ORAL | 11 refills | Status: DC

## 2017-06-26 MED ORDER — VARENICLINE TARTRATE 1 MG PO TABS: 1.0000 mg | ORAL_TABLET | Freq: Two times a day (BID) | ORAL | 0 refills | Status: DC

## 2017-06-26 MED FILL — $VIAGRA 100 MG TABLET: 100 | 30 days supply | Qty: 10 | Fill #4

## 2017-06-26 MED FILL — $CHANTIX 1 MG CONT MONTH BO: 1 | 28 days supply | Qty: 56 | Fill #0

## 2017-06-26 MED FILL — traMADol HCL 50 MG TABS: 50 | 30 days supply | Qty: 60 | Fill #0

## 2017-06-26 NOTE — Progress Notes (Signed)
Patient is here for HTN   Patient complains back pain & right knee pain

## 2017-06-26 NOTE — Patient Instructions (Signed)
Continue regular exercise. Continue to work on trying to quit the cigarettes.

## 2017-06-26 NOTE — Progress Notes (Signed)
Patient ID: Peter Friedman, male    DOB: 10-28-1965  MRN: [id number]  CC: No chief complaint on file.   Subjective: Peter Friedman is a 51 y.o. male who presents for chronic disease management.  Last saw Dr. Armen PickupFunches 03/2017 His concerns today include:  Patient with history of HTN, tobacco dependence, ED, chronic LBP with RT side sciatica, right knee pain 02/2015  1. Pain in lower back and RT knee -"if I do too much pain kicks in. I can't do as much as I use to." [facility]  A wk - water aerobics, treadmills and bicycle. Tramadol and Lyrica helps with pain and keeps him functional -have applied for disability and Dr. Armen PickupFunches gave letter of support before she left. -X-ray of the lumbar spine revealed mild degenerative changes -Had arthroscopy of right knee in 2016.  Pain did not improve post surgery.  Most recent x-ray revealed mild degenerative joint disease  2. HTN: no device to check blood pressure -compliant with amlodipine -SOB sometimes, uses Albuterol PRN. No formal dx asthma. No CP/LE edema  3.  Tob: smoking 1 cig/day. Use to be a pack a day. Trying to quit  4. Pos Depression screen: loss  Mom in 03/2017. Good family support.  No SI.  Does not feel he needs to speak with a counselor or been on any medication at this time  Patient Active Problem List   Diagnosis Date Noted  . Facet arthritis of lumbar region (HCC) 02/03/2017  . Pain of left calf 01/02/2017  . Papular rash 01/02/2017  . Erectile dysfunction 04/30/2016  . Plantar fasciitis, left 04/05/2016  . Seasonal allergies 02/01/2016  . HTN (hypertension) 11/28/2015  . Prediabetes 10/27/2015  . Degenerative tear of medial meniscus of right knee 08/24/2014  . Tobacco abuse 08/24/2014     Current Outpatient Medications on File Prior to Visit  Medication Sig Dispense Refill  . albuterol (PROVENTIL HFA;VENTOLIN HFA) 108 (90 Base) MCG/ACT inhaler Inhale 2 puffs into the lungs every 6 (six) hours as needed for  wheezing or shortness of breath. 54 g 3  . amLODipine (NORVASC) 10 MG tablet Take 1 tablet (10 mg total) by mouth daily. 30 tablet 11  . betamethasone dipropionate (DIPROLENE) 0.05 % ointment Apply topically 2 (two) times daily. 30 g 0  . diclofenac sodium (VOLTAREN) 1 % GEL Apply 2 g topically 4 (four) times daily. 100 g 5  . methocarbamol (ROBAXIN) 500 MG tablet Take 1 tablet (500 mg total) by mouth 2 (two) times daily. 60 tablet 0  . pregabalin (LYRICA) 100 MG capsule Take 1 capsule (100 mg total) by mouth 2 (two) times daily. Reported on 02/01/2016 60 capsule 2  . sildenafil (VIAGRA) 100 MG tablet Take 0.5-1 tablets (50-100 mg total) by mouth daily as needed for erectile dysfunction. For PASS 30 tablet 3  . traMADol (ULTRAM) 50 MG tablet Take 1 tablet (50 mg total) by mouth every 12 (twelve) hours as needed. 60 tablet 2  . varenicline (CHANTIX CONTINUING MONTH PAK) 1 MG tablet Take 1 tablet (1 mg total) by mouth 2 (two) times daily. 56 tablet 3   No current facility-administered medications on file prior to visit.     No Known Allergies  Social History   Socioeconomic History  . Marital status: Single    Spouse name: Not on file  . Number of children: Not on file  . Years of education: Not on file  . Highest education level: Not on  file  Social Needs  . Financial resource strain: Not on file  . Food insecurity - worry: Not on file  . Food insecurity - inability: Not on file  . Transportation needs - medical: Not on file  . Transportation needs - non-medical: Not on file  Occupational History  . Not on file  Tobacco Use  . Smoking status: Former Smoker    Packs/day: 0.50    Types: Cigarettes  . Smokeless tobacco: Never Used  Substance and Sexual Activity  . Alcohol use: No  . Drug use: No  . Sexual activity: Not on file  Other Topics Concern  . Not on file  Social History Narrative  . Not on file    Family History  Problem Relation Age of Onset  . Hypertension Mother    . Hypertension Father   . Lung cancer Brother   . Hypertension Sister     Past Surgical History:  Procedure Laterality Date  . KNEE ARTHROSCOPY Right 02/2015    ROS: Review of Systems Negative except as stated above PHYSICAL EXAM: BP 132/74 (BP Location: Left Arm, Patient Position: Sitting, Cuff Size: Normal)   Pulse 70   Temp 98.4 F (36.9 C) (Oral)   Resp 18   Ht 5\' 9"  (1.753 m)   Wt 237 lb 6.4 oz (107.7 kg)   SpO2 99%   BMI 35.06 kg/m   Wt Readings from Last 3 Encounters:  06/26/17 237 lb 6.4 oz (107.7 kg)  04/29/17 240 lb (108.9 kg)  03/25/17 236 lb (107 kg)    Physical Exam General appearance - alert, well appearing, middle-aged African-American male and in no distress Mental status - alert, oriented to person, place, and time, normal mood, behavior, speech, dress, motor activity, and thought processes Eyes - pupils equal and reactive, extraocular eye movements intact Mouth - mucous membranes moist, pharynx normal without lesions Neck - supple, no significant adenopathy Chest - clear to auscultation, no wheezes, rales or rhonchi, symmetric air entry Heart - normal rate, regular rhythm, normal S1, S2, no murmurs, rubs, clicks or gallops Musculoskeletal - RT knee: knee brace removed. Mild edema. No point tenderness. Good ROM.  Spine: mild tenderness on palpation of LS spine Extremities - no LE edema  Depression screen [facility] 2/9 03/25/2017 01/02/2017 11/01/2016 08/01/2016 06/12/2016  Decreased Interest 1 1 1  0 0  Down, Depressed, Hopeless 1 1 2 1  0  PHQ - 2 Score 2 2 3 1  0  Altered sleeping 1 1 1  0 -  Tired, decreased energy 1 1 2 1  -  Change in appetite 0 0 0 0 -  Feeling bad or failure about yourself  1 0 0 0 -  Trouble concentrating 1 0 1 0 -  Moving slowly or fidgety/restless 0 0 0 0 -  Suicidal thoughts 0 0 0 0 -  PHQ-9 Score 6 4 7 2  -  Some recent data might be hidden    ASSESSMENT AND PLAN: 1. Essential hypertension At goal. Cont Norvasc - CBC -  Comprehensive metabolic panel - Lipid panel - amLODipine (NORVASC) 10 MG tablet; Take 1 tablet (10 mg total) daily by mouth.  Dispense: 30 tablet; Refill: 11  2. Tobacco abuse -commended him on efforts to quit smoking - varenicline (CHANTIX CONTINUING MONTH PAK) 1 MG tablet; Take 1 tablet (1 mg total) 2 (two) times daily by mouth.  Dispense: 56 tablet; Refill: 0  3. Chronic pain of right knee Degenerative tear of medial meniscus of right knee -functional status  good on Lyrica and Tramadol -neg UDS 12/2016 NCC[facility] reviewed - traMADol (ULTRAM) 50 MG tablet; Take 1 tablet (50 mg total) every 12 (twelve) hours as needed by mouth.  Dispense: 60 tablet; Refill: 2  4. Chronic low back pain without sciatica, unspecified back pain laterality - traMADol (ULTRAM) 50 MG tablet; Take 1 tablet (50 mg total) every 12 (twelve) hours as needed by mouth.  Dispense: 60 tablet; Refill: 2  5. Colon cancer screening - Fecal occult blood, imunochemical  6. Positive depression screening -grief reaction. Doing well with family support   Patient was given the opportunity to ask questions.  Patient verbalized understanding of the plan and was able to repeat key elements of the plan.   No orders of the defined types were placed in this encounter.    Requested Prescriptions    No prescriptions requested or ordered in this encounter    No Follow-up on file.  Jonah Blueeborah Delquan Poucher, MD, FACP

## 2017-06-27 ENCOUNTER — Other Ambulatory Visit: Payer: Self-pay | Admitting: Internal Medicine

## 2017-06-27 LAB — COMPREHENSIVE METABOLIC PANEL
ALBUMIN: 4.4 g/dL (ref 3.5–5.5)
ALK PHOS: 56 IU/L (ref 39–117)
ALT: 12 IU/L (ref 0–44)
AST: 14 IU/L (ref 0–40)
Albumin/Globulin Ratio: 1.8 (ref 1.2–2.2)
BUN/Creatinine Ratio: 14 (ref 9–20)
BUN: 11 mg/dL (ref 6–24)
Bilirubin Total: <0.2 mg/dL (ref 0.0–1.2)
CALCIUM: 9.4 mg/dL (ref 8.7–10.2)
CO2: 21 mmol/L (ref 20–29)
CREATININE: 0.8 mg/dL (ref 0.76–1.27)
Chloride: 103 mmol/L (ref 96–106)
GFR calc Af Amer: 120 mL/min/{1.73_m2} (ref >59)
GFR, EST NON AFRICAN AMERICAN: 104 mL/min/{1.73_m2} (ref >59)
GLOBULIN, TOTAL: 2.4 g/dL (ref 1.5–4.5)
Glucose: 103 mg/dL — ABNORMAL HIGH (ref 65–99)
Potassium: 4.5 mmol/L (ref 3.5–5.2)
Sodium: 139 mmol/L (ref 134–144)
TOTAL PROTEIN: 6.8 g/dL (ref 6.0–8.5)

## 2017-06-27 LAB — LIPID PANEL
CHOL/HDL RATIO: 5.1 ratio — AB (ref 0.0–5.0)
CHOLESTEROL TOTAL: 202 mg/dL — AB (ref 100–199)
HDL: 40 mg/dL (ref >39)
LDL CALC: 148 mg/dL — AB (ref 0–99)
TRIGLYCERIDES: 69 mg/dL (ref 0–149)
VLDL CHOLESTEROL CAL: 14 mg/dL (ref 5–40)

## 2017-06-27 LAB — CBC
HEMATOCRIT: 41.3 % (ref 37.5–51.0)
HEMOGLOBIN: 14.4 g/dL (ref 13.0–17.7)
MCH: 28.3 pg (ref 26.6–33.0)
MCHC: 34.9 g/dL (ref 31.5–35.7)
MCV: 81 fL (ref 79–97)
Platelets: 329 10*3/uL (ref 150–379)
RBC: 5.08 x10E6/uL (ref 4.14–5.80)
RDW: 14.9 % (ref 12.3–15.4)
WBC: 5.6 10*3/uL (ref 3.4–10.8)

## 2017-06-27 MED ORDER — ATORVASTATIN CALCIUM 10 MG PO TABS: 10.0000 mg | ORAL_TABLET | Freq: Every day | ORAL | 3 refills | Status: DC

## 2017-06-27 MED FILL — ?ATORVASTATIN 10 MG TABLET: 10 | 30 days supply | Qty: 30 | Fill #0

## 2017-06-30 ENCOUNTER — Telehealth: Payer: Self-pay

## 2017-06-30 NOTE — Telephone Encounter (Signed)
Contacted pt to go over lab results pt is aware and doesn't have any questions or concerns 

## 2017-07-21 ENCOUNTER — Ambulatory Visit: Payer: Self-pay | Attending: Internal Medicine

## 2017-07-25 LAB — FECAL OCCULT BLOOD, IMMUNOCHEMICAL

## 2017-08-04 MED FILL — $VIAGRA 100 MG TABLET: 100 | 30 days supply | Qty: 10 | Fill #5

## 2017-08-07 MED FILL — ?ATORVASTATIN 10 MG TABLET: 10 | 30 days supply | Qty: 30 | Fill #1

## 2017-08-07 MED FILL — traMADol HCL 50 MG TABS: 50 | 30 days supply | Qty: 60 | Fill #1

## 2017-09-04 MED FILL — AMLODIPINE BESYLATE 10 MG T: 10 | 30 days supply | Qty: 30 | Fill #0

## 2017-09-04 MED FILL — $VIAGRA 100 MG TABLET: 100 | 30 days supply | Qty: 10 | Fill #6

## 2017-09-04 MED FILL — traMADol HCL 50 MG TABS: 50 | 30 days supply | Qty: 60 | Fill #2

## 2017-09-04 MED FILL — ?ATORVASTATIN 10 MG TABLET: 10 | 30 days supply | Qty: 30 | Fill #2

## 2017-09-26 ENCOUNTER — Encounter: Payer: Self-pay | Admitting: Internal Medicine

## 2017-09-26 ENCOUNTER — Ambulatory Visit: Payer: Self-pay | Attending: Internal Medicine | Admitting: Internal Medicine

## 2017-09-26 VITALS — BP 140/78 | HR 69 | Temp 98.4°F | Resp 16 | Ht 69.0 in | Wt 246.6 lb

## 2017-09-26 DIAGNOSIS — Z79899 Other long term (current) drug therapy: Secondary | ICD-10-CM | POA: Insufficient documentation

## 2017-09-26 DIAGNOSIS — G8929 Other chronic pain: Secondary | ICD-10-CM | POA: Insufficient documentation

## 2017-09-26 DIAGNOSIS — M545 Low back pain: Secondary | ICD-10-CM

## 2017-09-26 DIAGNOSIS — N529 Male erectile dysfunction, unspecified: Secondary | ICD-10-CM | POA: Insufficient documentation

## 2017-09-26 DIAGNOSIS — I10 Essential (primary) hypertension: Secondary | ICD-10-CM | POA: Insufficient documentation

## 2017-09-26 DIAGNOSIS — Z1211 Encounter for screening for malignant neoplasm of colon: Secondary | ICD-10-CM

## 2017-09-26 DIAGNOSIS — M25561 Pain in right knee: Secondary | ICD-10-CM | POA: Insufficient documentation

## 2017-09-26 DIAGNOSIS — F17210 Nicotine dependence, cigarettes, uncomplicated: Secondary | ICD-10-CM | POA: Insufficient documentation

## 2017-09-26 DIAGNOSIS — M25521 Pain in right elbow: Secondary | ICD-10-CM | POA: Insufficient documentation

## 2017-09-26 DIAGNOSIS — R7303 Prediabetes: Secondary | ICD-10-CM | POA: Insufficient documentation

## 2017-09-26 DIAGNOSIS — I2510 Atherosclerotic heart disease of native coronary artery without angina pectoris: Secondary | ICD-10-CM | POA: Insufficient documentation

## 2017-09-26 DIAGNOSIS — M5441 Lumbago with sciatica, right side: Secondary | ICD-10-CM | POA: Insufficient documentation

## 2017-09-26 DIAGNOSIS — Z720 Tobacco use: Secondary | ICD-10-CM

## 2017-09-26 DIAGNOSIS — S83241S Other tear of medial meniscus, current injury, right knee, sequela: Secondary | ICD-10-CM | POA: Insufficient documentation

## 2017-09-26 DIAGNOSIS — E785 Hyperlipidemia, unspecified: Secondary | ICD-10-CM | POA: Insufficient documentation

## 2017-09-26 DIAGNOSIS — M23203 Derangement of unspecified medial meniscus due to old tear or injury, right knee: Secondary | ICD-10-CM

## 2017-09-26 MED ORDER — TRAMADOL HCL 50 MG PO TABS: 50.0000 mg | ORAL_TABLET | Freq: Two times a day (BID) | ORAL | 2 refills | Status: DC | PRN

## 2017-09-26 MED ORDER — DICLOFENAC SODIUM 1 % TD GEL: 2.0000 g | Freq: Four times a day (QID) | TRANSDERMAL | 5 refills | Status: DC

## 2017-09-26 MED FILL — DICLOFENAC SODIUM 1% GEL: 1 | 12 days supply | Qty: 100 | Fill #0

## 2017-09-26 NOTE — Patient Instructions (Signed)
Use the Voltaren gel on the elbow.  If no improvement, we can refer to orthopedics.  Call 1-800-Quit-now for nicotine patches/gum.

## 2017-09-26 NOTE — Progress Notes (Signed)
Pt states he is losing grip in his right arm  Pt is requesting a refill on his Tramadol

## 2017-09-26 NOTE — Progress Notes (Signed)
Patient ID: IHAN PAT, male    DOB: [birth year]  MRN: [id number]  CC: Follow-up (3 month)   Subjective: Peter Friedman is a 52 y.o. male who presents for chronic ds management His concerns today include:  Patient with history of HTN, tobacco dependence, ED, chronic LBP with RT side sciatica, right knee pain 02/2015  1. Pain in RT elbow x 1 mh; intermittent; can last 1-2 days -no swelling -no initiating factor -Has affected grip in RT hand.  No numbness or tingling  2.  FIT test: Given on last visit.  Patient did turning the specimen and however we forgot to give a lab slip with it.  Patient agreeable to doing the test again  3.  HL: Elevated ASCVD score on lipid profile done last visit.  I recommended starting Lipitor.  Compliant with Lipitor.    4.  Tob:  Given Chantix on last visit.  Had to stop after a few wks due to bad thoughts.  Reports that his smoking has decreased significantly.  He would like to try the nicotine gum.    5.  Chronic pain right knee and lower back.:  Requests refill on tramadol.  Doing well with the medication.  Denies any significant side effects.  Wears a knee brace on the right knee for support.  Had arthroscopy of the right knee in 2016.  Symptoms did not improve post surgery.  Goes to the [facility] 2-3 times a week He has mild degenerative joint disease in the lower back  6.  HTN: compliant with Norvasc Goes o [facility] 2-3 x a wk Chest pains or shortness of breath at rest on exertion.  No lower extremity edema. Patient Active Problem List   Diagnosis Date Noted  . Facet arthritis of lumbar region (HCC) 02/03/2017  . Papular rash 01/02/2017  . Erectile dysfunction 04/30/2016  . Plantar fasciitis, left 04/05/2016  . Seasonal allergies 02/01/2016  . HTN (hypertension) 11/28/2015  . Prediabetes 10/27/2015  . Degenerative tear of medial meniscus of right knee 08/24/2014  . Tobacco abuse 08/24/2014  . Chronic pain of right knee 07/13/2014     Current  Outpatient Medications on File Prior to Visit  Medication Sig Dispense Refill  . albuterol (PROVENTIL HFA;VENTOLIN HFA) 108 (90 Base) MCG/ACT inhaler Inhale 2 puffs into the lungs every 6 (six) hours as needed for wheezing or shortness of breath. 54 g 3  . amLODipine (NORVASC) 10 MG tablet Take 1 tablet (10 mg total) daily by mouth. 30 tablet 11  . atorvastatin (LIPITOR) 10 MG tablet Take 1 tablet (10 mg total) daily by mouth. 90 tablet 3  . betamethasone dipropionate (DIPROLENE) 0.05 % ointment Apply topically 2 (two) times daily. 30 g 0  . methocarbamol (ROBAXIN) 500 MG tablet Take 1 tablet (500 mg total) by mouth 2 (two) times daily. 60 tablet 0  . pregabalin (LYRICA) 100 MG capsule Take 1 capsule (100 mg total) by mouth 2 (two) times daily. Reported on 02/01/2016 60 capsule 2  . sildenafil (VIAGRA) 100 MG tablet Take 0.5-1 tablets (50-100 mg total) by mouth daily as needed for erectile dysfunction. For PASS 30 tablet 3  . varenicline (CHANTIX CONTINUING MONTH PAK) 1 MG tablet Take 1 tablet (1 mg total) 2 (two) times daily by mouth. 56 tablet 0   No current facility-administered medications on file prior to visit.     Allergies  Allergen Reactions  . Chantix [Varenicline] Other (See Comments)    Bad thoughts  Social History   Socioeconomic History  . Marital status: Single    Spouse name: Not on file  . Number of children: Not on file  . Years of education: Not on file  . Highest education level: Not on file  Social Needs  . Financial resource strain: Not on file  . Food insecurity - worry: Not on file  . Food insecurity - inability: Not on file  . Transportation needs - medical: Not on file  . Transportation needs - non-medical: Not on file  Occupational History  . Not on file  Tobacco Use  . Smoking status: Former Smoker    Packs/day: 0.50    Types: Cigarettes  . Smokeless tobacco: Never Used  Substance and Sexual Activity  . Alcohol use: No  . Drug use: No  . Sexual  activity: Not on file  Other Topics Concern  . Not on file  Social History Narrative  . Not on file    Family History  Problem Relation Age of Onset  . Hypertension Mother   . Hypertension Father   . Lung cancer Brother   . Hypertension Sister     Past Surgical History:  Procedure Laterality Date  . KNEE ARTHROSCOPY Right 02/2015    ROS: Review of Systems Negative except as stated above. PHYSICAL EXAM: BP 140/78   Pulse 69   Temp 98.4 F (36.9 C) (Oral)   Resp 16   Ht 5\' 9"  (1.753 m)   Wt 246 lb 9.6 oz (111.9 kg)   SpO2 97%   BMI 36.42 kg/m   Wt Readings from Last 3 Encounters:  09/26/17 246 lb 9.6 oz (111.9 kg)  06/26/17 237 lb 6.4 oz (107.7 kg)  04/29/17 240 lb (108.9 kg)   Repeat BP 140/78  Physical Exam  General appearance - alert, well appearing, and in no distress Mental status - alert, oriented to person, place, and time, normal mood, behavior, speech, dress, motor activity, and thought processes Neck - supple, no significant adenopathy Chest - clear to auscultation, no wheezes, rales or rhonchi, symmetric air entry Heart - normal rate, regular rhythm, normal S1, S2, no murmurs, rubs, clicks or gallops Musculoskeletal -right elbow: No edema or erythema.  No point tenderness.  Mild discomfort with passive range of motion.  Grip slightly decreased on the right compared to the left.  Power proximally and distally in the upper extremities 5/5 Extremities -no lower extremity edema. Right knee: Wearing a knee brace.  It was not removed.  Results for orders placed or performed in visit on 06/26/17  Fecal occult blood, imunochemical  Result Value Ref Range   Fecal Occult Bld CANCELED   CBC  Result Value Ref Range   WBC 5.6 3.4 - 10.8 x10E3/uL   RBC 5.08 4.14 - 5.80 x10E6/uL   Hemoglobin 14.4 13.0 - 17.7 g/dL   Hematocrit 65.7 84.6 - 51.0 %   MCV 81 79 - 97 fL   MCH 28.3 26.6 - 33.0 pg   MCHC 34.9 31.5 - 35.7 g/dL   RDW 96.2 95.2 - 84.1 %   Platelets 329  150 - 379 x10E3/uL  Comprehensive metabolic panel  Result Value Ref Range   Glucose 103 (H) 65 - 99 mg/dL   BUN 11 6 - 24 mg/dL   Creatinine, Ser 3.24 0.76 - 1.27 mg/dL   GFR calc non Af Amer 104 >59 mL/min/1.73   GFR calc Af Amer 120 >59 mL/min/1.73   BUN/Creatinine Ratio 14 9 - 20  Sodium 139 134 - 144 mmol/L   Potassium 4.5 3.5 - 5.2 mmol/L   Chloride 103 96 - 106 mmol/L   CO2 21 20 - 29 mmol/L   Calcium 9.4 8.7 - 10.2 mg/dL   Total Protein 6.8 6.0 - 8.5 g/dL   Albumin 4.4 3.5 - 5.5 g/dL   Globulin, Total 2.4 1.5 - 4.5 g/dL   Albumin/Globulin Ratio 1.8 1.2 - 2.2   Bilirubin Total <0.2 0.0 - 1.2 mg/dL   Alkaline Phosphatase 56 39 - 117 IU/L   AST 14 0 - 40 IU/L   ALT 12 0 - 44 IU/L  Lipid panel  Result Value Ref Range   Cholesterol, Total 202 (H) 100 - 199 mg/dL   Triglycerides 69 0 - 149 mg/dL   HDL 40 >16>39 mg/dL   VLDL Cholesterol Cal 14 5 - 40 mg/dL   LDL Calculated 109148 (H) 0 - 99 mg/dL   Chol/HDL Ratio 5.1 (H) 0.0 - 5.0 ratio    ASSESSMENT AND PLAN: 1. Right elbow pain - DG Elbow Complete Right; Future - diclofenac sodium (VOLTAREN) 1 % GEL; Apply 2 g topically 4 (four) times daily.  Dispense: 100 g; Refill: 5  2. Colon cancer screening - Fecal occult blood, imunochemical([facility])  3. Hyperlipidemia, unspecified hyperlipidemia type Continue Lipitor.  4. Tobacco abuse Commended him on trying to quit.  Advised to call 1 [phone] quit now to request the nicotine patches and gum.  Discontinue Chantix  5. Degenerative tear of medial meniscus of right knee Negative urine drug screen 12/2016. NNCSRS reviewed  - traMADol (ULTRAM) 50 MG tablet; Take 1 tablet (50 mg total) by mouth every 12 (twelve) hours as needed.  Dispense: 60 tablet; Refill: 2  6. Chronic low back pain without sciatica, unspecified back pain laterality - traMADol (ULTRAM) 50 MG tablet; Take 1 tablet (50 mg total) by mouth every 12 (twelve) hours as needed.  Dispense: 60 tablet; Refill:  2  Patient was given the opportunity to ask questions.  Patient verbalized understanding of the plan and was able to repeat key elements of the plan.   Orders Placed This Encounter  Procedures  . Fecal occult blood, imunochemical([facility])  . DG Elbow Complete Right     Requested Prescriptions   Signed Prescriptions Disp Refills  . diclofenac sodium (VOLTAREN) 1 % GEL 100 g 5    Sig: Apply 2 g topically 4 (four) times daily.  . traMADol (ULTRAM) 50 MG tablet 60 tablet 2    Sig: Take 1 tablet (50 mg total) by mouth every 12 (twelve) hours as needed.    Return in about 4 months (around 01/24/2018).  Jonah Blueeborah Kenlei Safi, MD, FACP

## 2017-10-02 ENCOUNTER — Telehealth: Payer: Self-pay | Admitting: Internal Medicine

## 2017-10-02 MED FILL — $VIAGRA 100 MG TABLET: 100 | 30 days supply | Qty: 10 | Fill #7

## 2017-10-02 MED FILL — AMLODIPINE BESYLATE 10 MG T: 10 | 30 days supply | Qty: 30 | Fill #1

## 2017-10-02 MED FILL — ?ATORVASTATIN 10 MG TABLET: 10 | 30 days supply | Qty: 30 | Fill #3

## 2017-10-02 NOTE — Telephone Encounter (Signed)
Will forward to pcp

## 2017-10-02 NOTE — Telephone Encounter (Signed)
Pt. Came to the facility to drop off a medical examination form to be filled out by PCP. Pt. Was told that form would be placed in PCP box and the nurse would give him a call once it has been completed. Please f/u

## 2017-10-07 MED FILL — traMADol HCL 50 MG TABS: 50 | 30 days supply | Qty: 60 | Fill #0

## 2017-10-09 NOTE — Telephone Encounter (Signed)
Called pt. And scheduled him an appt. For 10/13/17 for medical examination.

## 2017-10-13 ENCOUNTER — Ambulatory Visit: Payer: Self-pay | Attending: Internal Medicine | Admitting: Internal Medicine

## 2017-10-13 ENCOUNTER — Encounter: Payer: Self-pay | Admitting: Internal Medicine

## 2017-10-13 VITALS — BP 138/86 | HR 77 | Temp 98.3°F | Resp 16 | Ht 69.0 in | Wt 243.0 lb

## 2017-10-13 DIAGNOSIS — I10 Essential (primary) hypertension: Secondary | ICD-10-CM | POA: Insufficient documentation

## 2017-10-13 DIAGNOSIS — Z87891 Personal history of nicotine dependence: Secondary | ICD-10-CM | POA: Insufficient documentation

## 2017-10-13 DIAGNOSIS — Z888 Allergy status to other drugs, medicaments and biological substances status: Secondary | ICD-10-CM | POA: Insufficient documentation

## 2017-10-13 DIAGNOSIS — M23203 Derangement of unspecified medial meniscus due to old tear or injury, right knee: Secondary | ICD-10-CM | POA: Insufficient documentation

## 2017-10-13 DIAGNOSIS — Z801 Family history of malignant neoplasm of trachea, bronchus and lung: Secondary | ICD-10-CM | POA: Insufficient documentation

## 2017-10-13 DIAGNOSIS — E785 Hyperlipidemia, unspecified: Secondary | ICD-10-CM | POA: Insufficient documentation

## 2017-10-13 DIAGNOSIS — N529 Male erectile dysfunction, unspecified: Secondary | ICD-10-CM | POA: Insufficient documentation

## 2017-10-13 DIAGNOSIS — M545 Low back pain: Secondary | ICD-10-CM | POA: Insufficient documentation

## 2017-10-13 DIAGNOSIS — R7303 Prediabetes: Secondary | ICD-10-CM | POA: Insufficient documentation

## 2017-10-13 DIAGNOSIS — G8929 Other chronic pain: Secondary | ICD-10-CM | POA: Insufficient documentation

## 2017-10-13 DIAGNOSIS — Z79899 Other long term (current) drug therapy: Secondary | ICD-10-CM | POA: Insufficient documentation

## 2017-10-13 DIAGNOSIS — Z8249 Family history of ischemic heart disease and other diseases of the circulatory system: Secondary | ICD-10-CM | POA: Insufficient documentation

## 2017-10-13 NOTE — Progress Notes (Signed)
Patient ID: Peter Friedman, male    DOB: [birth year]  MRN: [id number]  CC: paperwork   Subjective: Peter Friedman is a 52 y.o. male who presents for UC visit/forms visit His concerns today include:  Patient with history of HTN, tobacco dependence,ED, chronic LBP withRTside sciatica, right knee pain 02/2015  [place] [facility] [place] [place] [facility]. He got behind on paying child support and was asked to have form completed by his PCP explaining why he is unable to work at this time He has not worked for a couple of yrs.  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  Since having surgery on his knee in 2016, he can not climb ladder, bend or stand for too long.  Also with his back can not lift with out causing flare.  Had disability hearing 08/27/2017.  Told it can be 4-6 mths to get decision Patient Active Problem List   Diagnosis Date Noted  . Hyperlipidemia 09/26/2017  . Facet arthritis of lumbar region (HCC) 02/03/2017  . Papular rash 01/02/2017  . Erectile dysfunction 04/30/2016  . Plantar fasciitis, left 04/05/2016  . Seasonal allergies 02/01/2016  . HTN (hypertension) 11/28/2015  . Prediabetes 10/27/2015  . Degenerative tear of medial meniscus of right knee 08/24/2014  . Tobacco abuse 08/24/2014  . Chronic pain of right knee 07/13/2014     Current Outpatient Medications on File Prior to Visit  Medication Sig Dispense Refill  . albuterol (PROVENTIL HFA;VENTOLIN HFA) 108 (90 Base) MCG/ACT inhaler Inhale 2 puffs into the lungs every 6 (six) hours as needed for wheezing or shortness of breath. 54 g 3  . amLODipine (NORVASC) 10 MG tablet Take 1 tablet (10 mg total) daily by mouth. 30 tablet 11  . atorvastatin (LIPITOR) 10 MG tablet Take 1 tablet (10 mg total) daily by mouth. 90 tablet 3  . betamethasone dipropionate (DIPROLENE) 0.05 % ointment Apply topically 2 (two) times daily. 30 g 0  . diclofenac sodium (VOLTAREN) 1 % GEL Apply 2 g topically 4 (four) times daily. 100 g 5  .  methocarbamol (ROBAXIN) 500 MG tablet Take 1 tablet (500 mg total) by mouth 2 (two) times daily. 60 tablet 0  . pregabalin (LYRICA) 100 MG capsule Take 1 capsule (100 mg total) by mouth 2 (two) times daily. Reported on 02/01/2016 60 capsule 2  . sildenafil (VIAGRA) 100 MG tablet Take 0.5-1 tablets (50-100 mg total) by mouth daily as needed for erectile dysfunction. For PASS 30 tablet 3  . traMADol (ULTRAM) 50 MG tablet Take 1 tablet (50 mg total) by mouth every 12 (twelve) hours as needed. 60 tablet 2  . varenicline (CHANTIX CONTINUING MONTH PAK) 1 MG tablet Take 1 tablet (1 mg total) 2 (two) times daily by mouth. 56 tablet 0   No current facility-administered medications on file prior to visit.     Allergies  Allergen Reactions  . Chantix [Varenicline] Other (See Comments)    Bad thoughts    Social History   Socioeconomic History  . Marital status: Single    Spouse name: Not on file  . Number of children: Not on file  . Years of education: Not on file  . Highest education level: Not on file  Social Needs  . Financial resource strain: Not on file  . Food insecurity - worry: Not on file  . Food insecurity - inability: Not on file  . Transportation needs - medical: Not on file  . Transportation needs - non-medical: Not on file  Occupational History  . Not on file  Tobacco Use  . Smoking status: Former Smoker    Packs/day: 0.50    Types: Cigarettes  . Smokeless tobacco: Never Used  Substance and Sexual Activity  . Alcohol use: No  . Drug use: No  . Sexual activity: Not on file  Other Topics Concern  . Not on file  Social History Narrative  . Not on file    Family History  Problem Relation Age of Onset  . Hypertension Mother   . Hypertension Father   . Lung cancer Brother   . Hypertension Sister     Past Surgical History:  Procedure Laterality Date  . KNEE ARTHROSCOPY Right 02/2015    ROS: Review of Systems Negative except as stated above PHYSICAL EXAM: BP  138/86   Pulse 77   Temp 98.3 F (36.8 C) (Oral)   Resp 16   Ht 5\' 9"  (1.753 m)   Wt 243 lb (110.2 kg)   SpO2 98%   BMI 35.88 kg/m   Physical Exam  General appearance - alert, well appearing, and in no distress ASSESSMENT AND PLAN: 1. Degenerative tear of medial meniscus of right knee 2. Chronic low back pain without sciatica, unspecified back pain laterality Form completed and given to patient. I will see him again on his routine follow-up visit.  Patient was given the opportunity to ask questions.  Patient verbalized understanding of the plan and was able to repeat key elements of the plan.   No orders of the defined types were placed in this encounter.    Requested Prescriptions    No prescriptions requested or ordered in this encounter    No Follow-up on file.  Jonah Blue, MD, FACP

## 2017-10-25 IMAGING — DX DG CHEST 2V
2 series · 2 of 2 positions shown · non-contrast
Comparison: None.

CLINICAL DATA: Shortness of breath, wheezing.

EXAM:
CHEST  2 VIEW

[w chest pa]
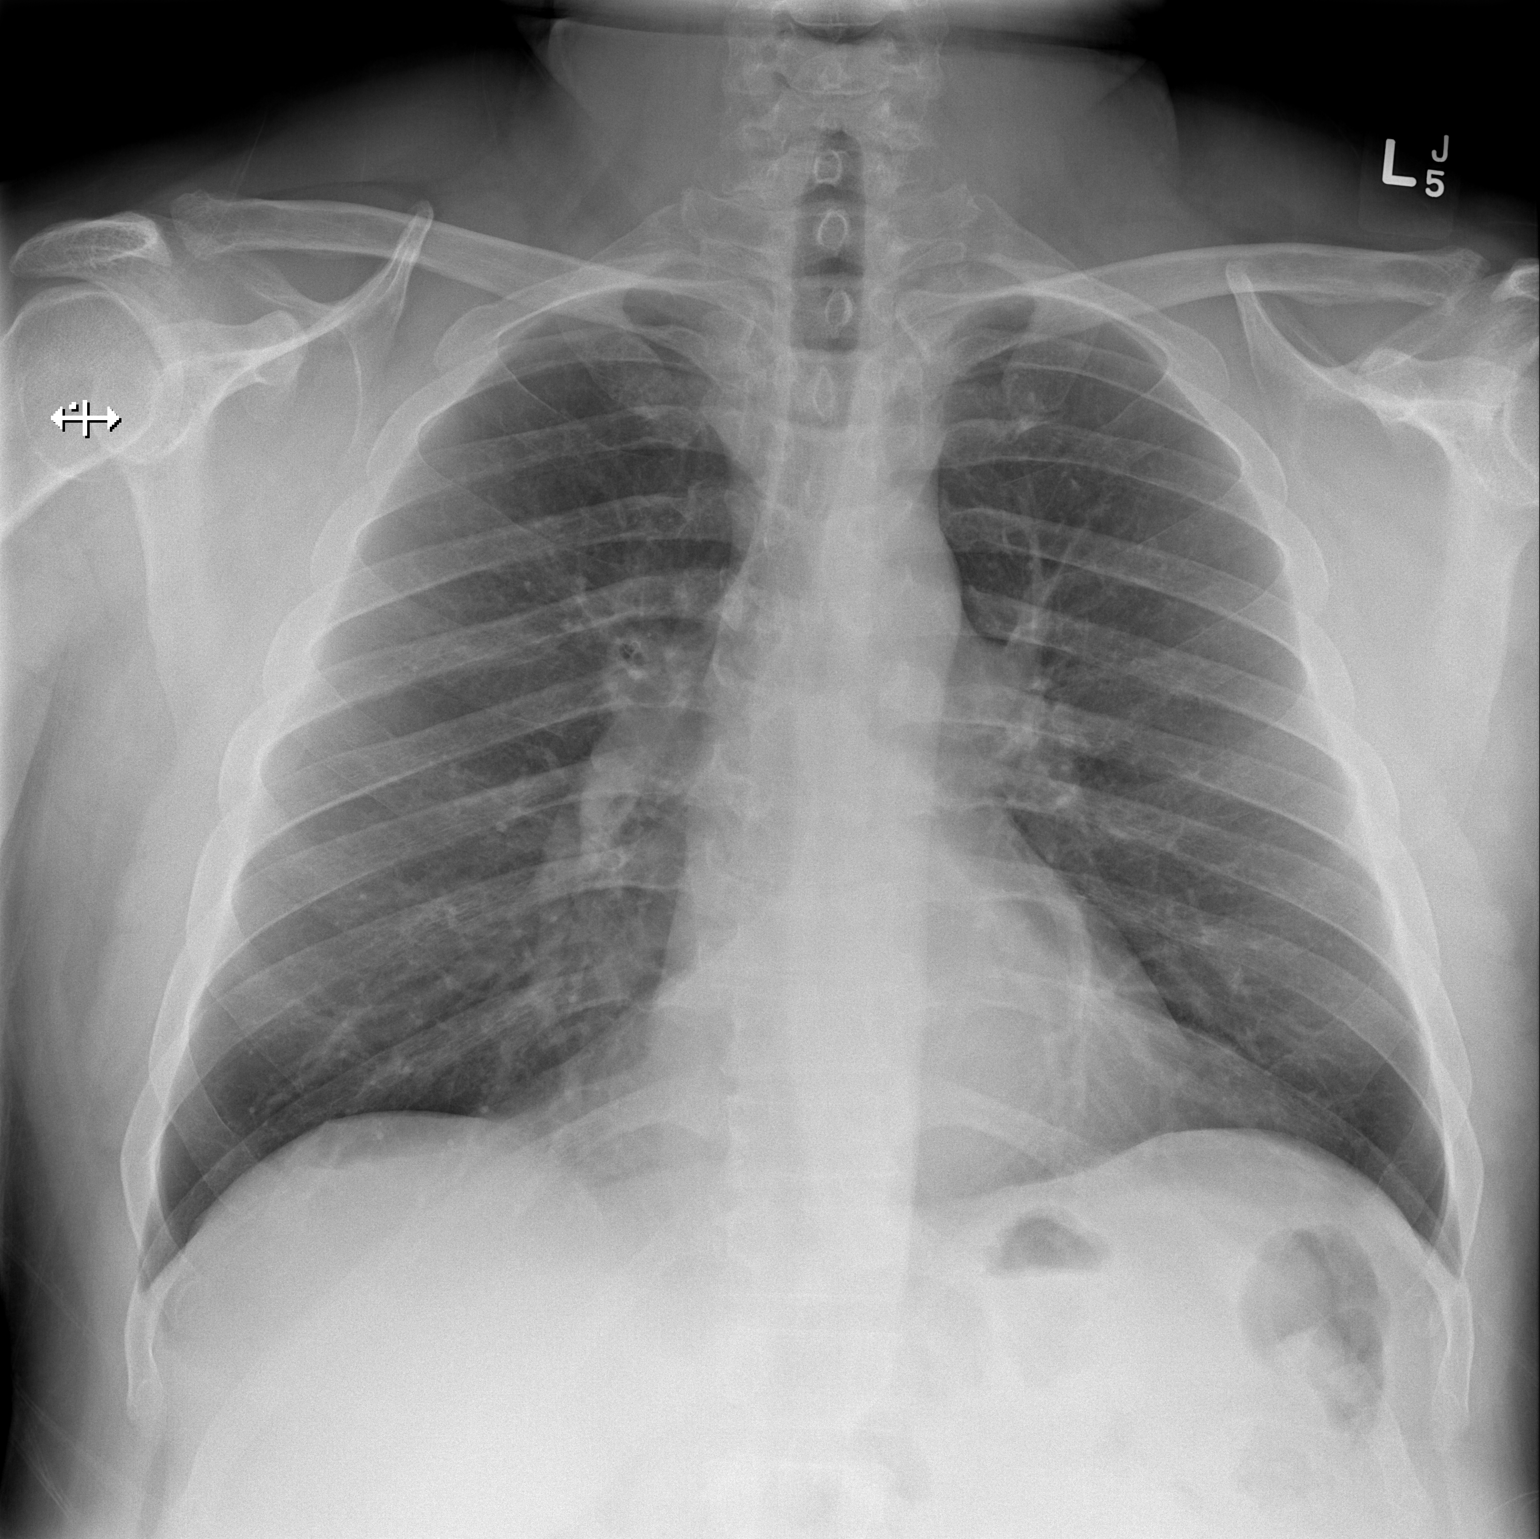

[w chest lat]
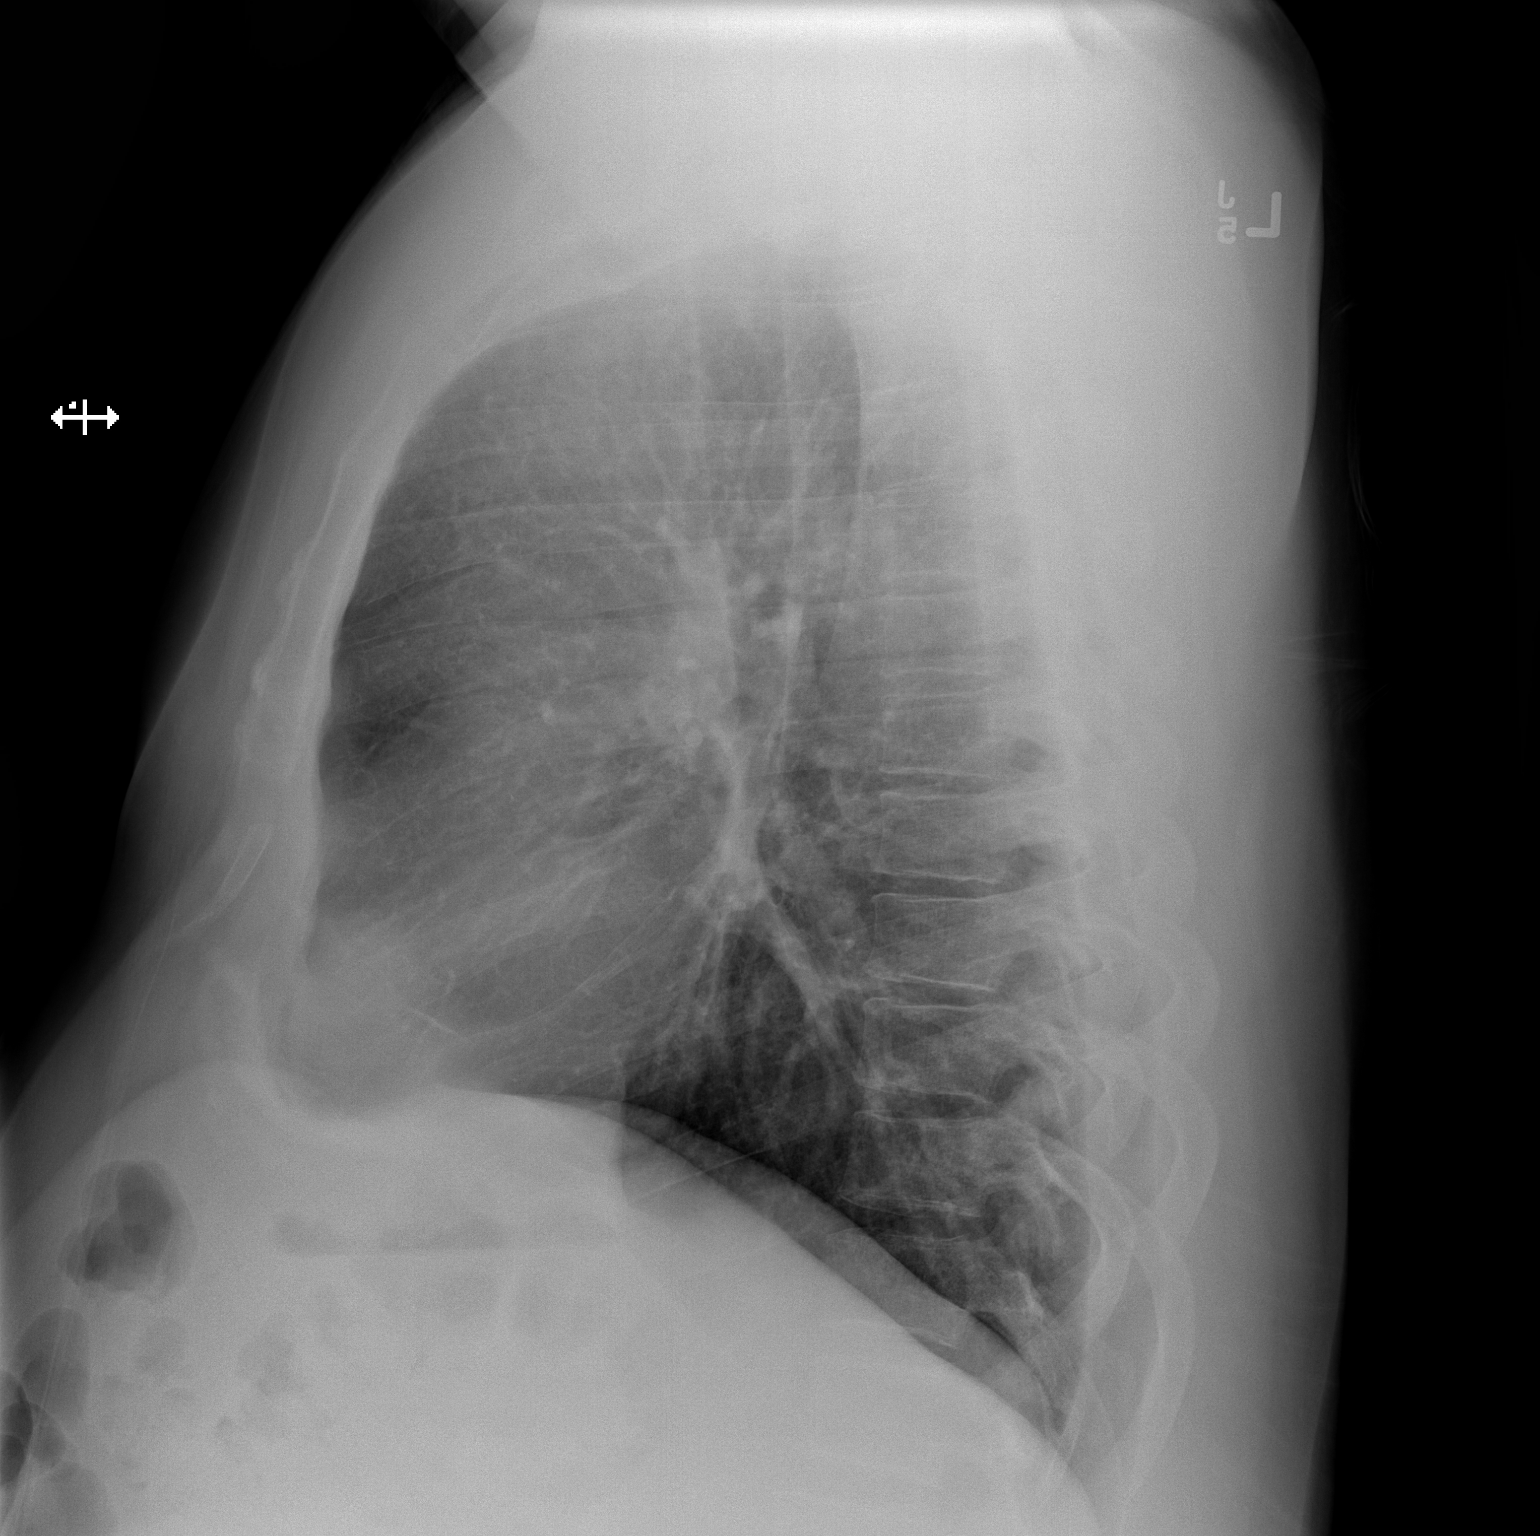

[2 of 2 positions shown; findings below may reference images not displayed]

FINDINGS: The heart size and mediastinal contours are within normal limits.
Both lungs are clear. No pneumothorax or pleural effusion is noted.
The visualized skeletal structures are unremarkable.
IMPRESSION: No active cardiopulmonary disease.

## 2017-10-30 LAB — FECAL OCCULT BLOOD, IMMUNOCHEMICAL

## 2017-10-31 ENCOUNTER — Telehealth: Payer: Self-pay

## 2017-10-31 NOTE — Telephone Encounter (Signed)
Contacted pt and informed that the lab discard his stool and we will give him another one at his f/u visit

## 2017-11-03 MED FILL — traMADol HCL 50 MG TABS: 50 | 30 days supply | Qty: 60 | Fill #1

## 2017-11-03 MED FILL — $VIAGRA 100 MG TABLET: 100 | 30 days supply | Qty: 10 | Fill #8

## 2017-11-03 MED FILL — ?ATORVASTATIN 10 MG TABLET: 10 | 30 days supply | Qty: 30 | Fill #4

## 2017-11-03 MED FILL — AMLODIPINE BESYLATE 10 MG T: 10 | 30 days supply | Qty: 30 | Fill #2

## 2017-11-06 ENCOUNTER — Ambulatory Visit: Payer: Self-pay | Attending: Internal Medicine

## 2017-12-01 ENCOUNTER — Other Ambulatory Visit: Payer: Self-pay

## 2017-12-01 DIAGNOSIS — N529 Male erectile dysfunction, unspecified: Secondary | ICD-10-CM

## 2017-12-01 MED FILL — traMADol HCL 50 MG TABS: 50 | 30 days supply | Qty: 60 | Fill #2

## 2017-12-01 MED FILL — AMLODIPINE BESYLATE 10 MG T: 10 | 30 days supply | Qty: 30 | Fill #3

## 2017-12-01 MED FILL — ?ATORVASTATIN 10 MG TABLET: 10 | 30 days supply | Qty: 30 | Fill #5

## 2017-12-30 ENCOUNTER — Other Ambulatory Visit: Payer: Self-pay

## 2017-12-30 ENCOUNTER — Other Ambulatory Visit: Payer: Self-pay | Admitting: Internal Medicine

## 2017-12-30 DIAGNOSIS — G8929 Other chronic pain: Secondary | ICD-10-CM

## 2017-12-30 DIAGNOSIS — M545 Low back pain, unspecified: Secondary | ICD-10-CM

## 2017-12-30 DIAGNOSIS — N529 Male erectile dysfunction, unspecified: Secondary | ICD-10-CM

## 2017-12-30 DIAGNOSIS — M23203 Derangement of unspecified medial meniscus due to old tear or injury, right knee: Secondary | ICD-10-CM

## 2017-12-30 MED ORDER — SILDENAFIL CITRATE 100 MG PO TABS: 50.0000 mg | ORAL_TABLET | Freq: Every day | ORAL | 3 refills | Status: DC | PRN

## 2018-01-02 NOTE — Telephone Encounter (Signed)
Last filled 09/26/17. Patient refill requested

## 2018-01-06 NOTE — Telephone Encounter (Signed)
Pt requesting RF on Tramadol.  Pt with chronic LBP and LT knee pain due to meniscus ds. NCCSRS reviewed and is appropriate. RF given on Tramadol

## 2018-01-06 NOTE — Telephone Encounter (Signed)
Pt is aware.  

## 2018-01-07 ENCOUNTER — Other Ambulatory Visit: Payer: Self-pay | Admitting: Internal Medicine

## 2018-01-07 DIAGNOSIS — Z720 Tobacco use: Secondary | ICD-10-CM

## 2018-01-07 MED FILL — traMADol HCL 50 MG TABS: 50 | 30 days supply | Qty: 60 | Fill #0

## 2018-01-07 MED FILL — ATORVASTATIN 10 MG TABLET: 10 | 30 days supply | Qty: 30 | Fill #6

## 2018-01-07 MED FILL — $VIAGRA 100 MG TABLET: 100 | 30 days supply | Qty: 10 | Fill #0

## 2018-01-07 MED FILL — AMLODIPINE BESYLATE 10 MG T: 10 | 30 days supply | Qty: 30 | Fill #4

## 2018-01-12 IMAGING — CR DG KNEE COMPLETE 4+V*R*
1 series · 1 of 1 positions shown · non-contrast
Comparison: None.

CLINICAL DATA: Right knee pain and swelling

EXAM:
RIGHT KNEE - COMPLETE 4+ VIEW

[view not recorded]
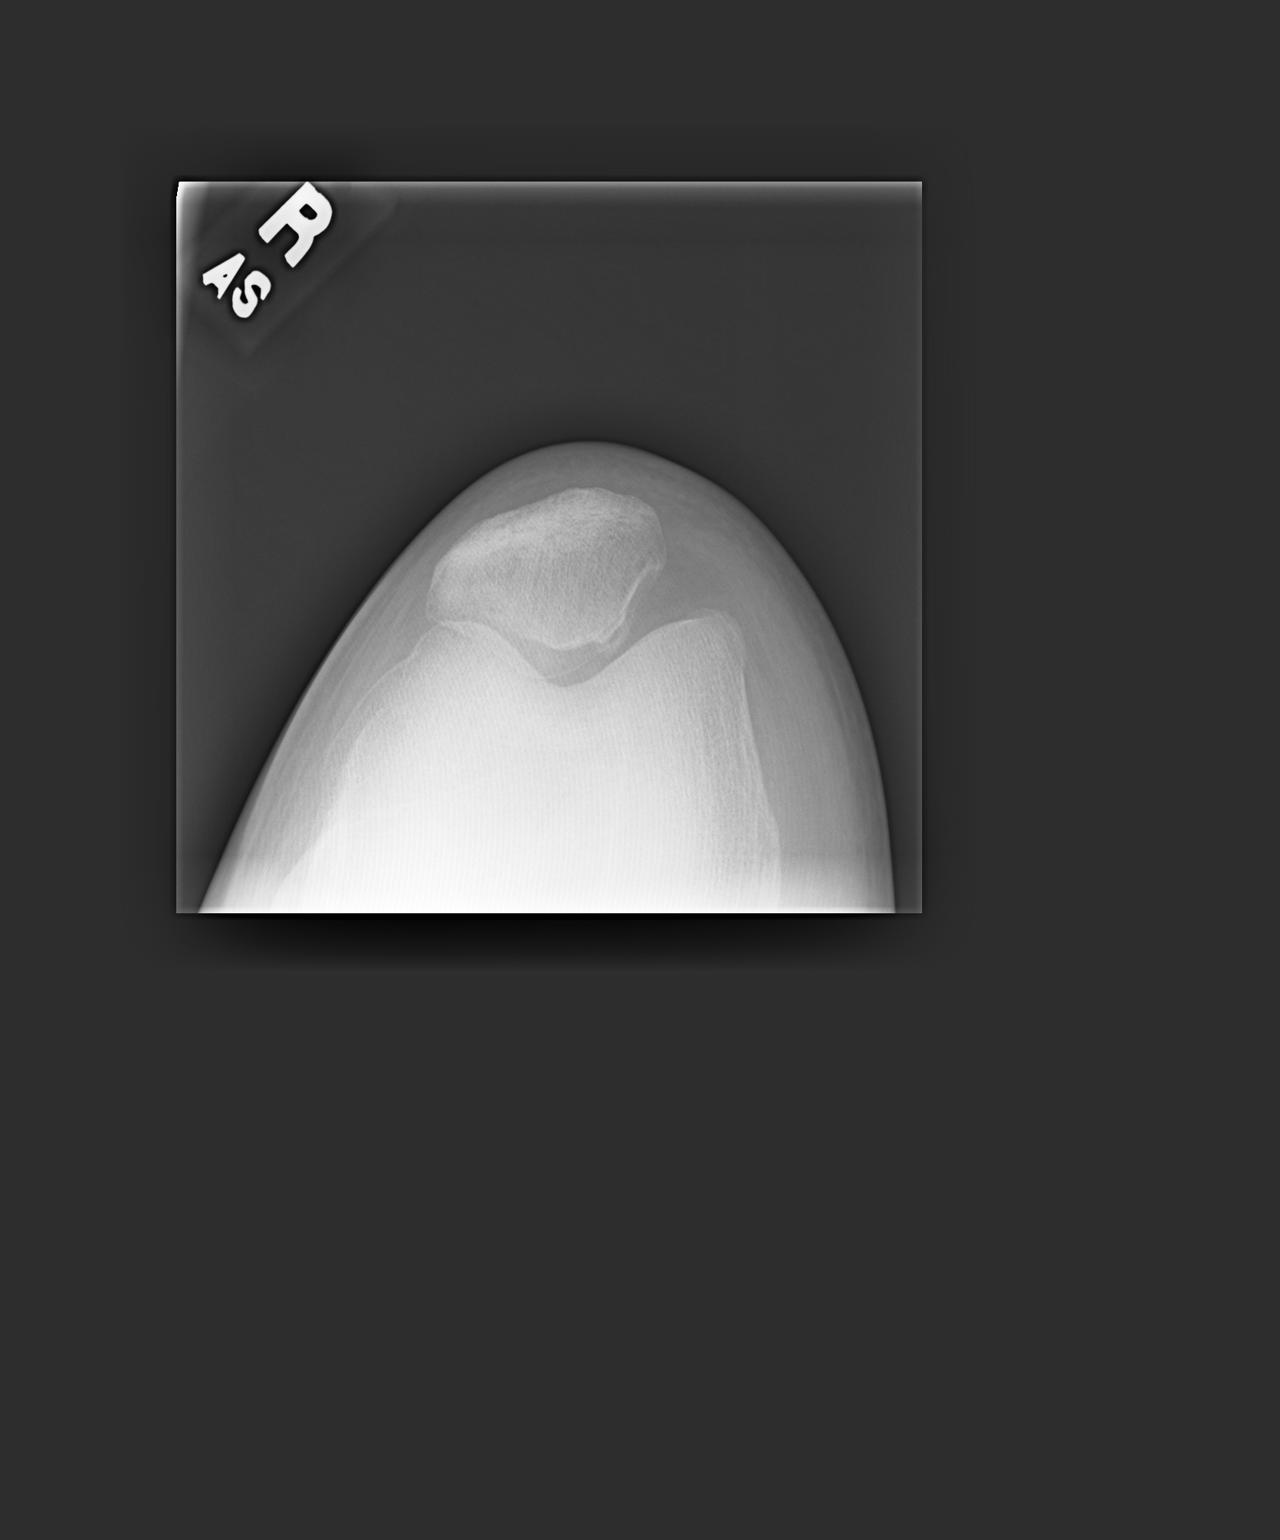

[1 of 1 positions shown; findings below may reference images not displayed]

FINDINGS: Mild joint space narrowing medially with early spurring. Lateral
joint space normal. Patellofemoral joint normal. No effusion.
Negative for fracture.
IMPRESSION: Mild medial joint space narrowing.

## 2018-01-26 ENCOUNTER — Encounter: Payer: Self-pay | Admitting: Internal Medicine

## 2018-01-26 ENCOUNTER — Ambulatory Visit: Payer: Self-pay | Attending: Internal Medicine | Admitting: Internal Medicine

## 2018-01-26 VITALS — BP 116/79 | HR 65 | Temp 98.6°F | Resp 16 | Wt 244.0 lb

## 2018-01-26 DIAGNOSIS — N529 Male erectile dysfunction, unspecified: Secondary | ICD-10-CM | POA: Insufficient documentation

## 2018-01-26 DIAGNOSIS — M5441 Lumbago with sciatica, right side: Secondary | ICD-10-CM | POA: Insufficient documentation

## 2018-01-26 DIAGNOSIS — Z79899 Other long term (current) drug therapy: Secondary | ICD-10-CM | POA: Insufficient documentation

## 2018-01-26 DIAGNOSIS — Z8249 Family history of ischemic heart disease and other diseases of the circulatory system: Secondary | ICD-10-CM | POA: Insufficient documentation

## 2018-01-26 DIAGNOSIS — Z87891 Personal history of nicotine dependence: Secondary | ICD-10-CM | POA: Insufficient documentation

## 2018-01-26 DIAGNOSIS — E785 Hyperlipidemia, unspecified: Secondary | ICD-10-CM

## 2018-01-26 DIAGNOSIS — M1731 Unilateral post-traumatic osteoarthritis, right knee: Secondary | ICD-10-CM

## 2018-01-26 DIAGNOSIS — M722 Plantar fascial fibromatosis: Secondary | ICD-10-CM | POA: Insufficient documentation

## 2018-01-26 DIAGNOSIS — Z720 Tobacco use: Secondary | ICD-10-CM

## 2018-01-26 DIAGNOSIS — M25561 Pain in right knee: Secondary | ICD-10-CM | POA: Insufficient documentation

## 2018-01-26 DIAGNOSIS — M47816 Spondylosis without myelopathy or radiculopathy, lumbar region: Secondary | ICD-10-CM

## 2018-01-26 DIAGNOSIS — G8929 Other chronic pain: Secondary | ICD-10-CM | POA: Insufficient documentation

## 2018-01-26 DIAGNOSIS — I10 Essential (primary) hypertension: Secondary | ICD-10-CM

## 2018-01-26 DIAGNOSIS — R7303 Prediabetes: Secondary | ICD-10-CM | POA: Insufficient documentation

## 2018-01-26 MED ORDER — DICLOFENAC SODIUM 1 % TD GEL: 2.0000 g | Freq: Four times a day (QID) | TRANSDERMAL | 1 refills | Status: DC

## 2018-01-26 MED ORDER — NICOTINE 7 MG/24HR TD PT24: 7.0000 mg | MEDICATED_PATCH | Freq: Every day | TRANSDERMAL | 1 refills | Status: DC

## 2018-01-26 MED ORDER — NICOTINE POLACRILEX 2 MG MT GUM: 2.0000 mg | CHEWING_GUM | OROMUCOSAL | 0 refills | Status: DC | PRN

## 2018-01-26 NOTE — Patient Instructions (Signed)
Use the nicotine patches and gum as instructed.

## 2018-01-26 NOTE — Progress Notes (Signed)
Patient ID: Peter Friedman, male    DOB: [birth year]  MRN: [id number]  CC: Follow-up   Subjective: Peter Friedman is a 52 y.o. male who presents for chronic ds management. His concerns today include:  Patient with history of HTN, tobacco dependence,HL, ED, chronic LBP withRTside sciatica, right knee pain since 02/2015  He was denied disability.  Plans to reapply.  "This leg just won't let me do what I would like to."  Would like referral back back to Dr. Margaretha Sheffield. -request RF on Tramadol.  Denies any constipation or drowsiness -Still goes to gym 3-4 times a wk to swim and ride stationary bike  HTN/HL:  Compliant with meds. No CP/SOB Some swelling in RT foot.  Limits salt as much as I can.   Tob dep:  Reports he has cut back significantly.  Down to 1 cig/wk Would like to try patches or gum to get him over the hump Patient Active Problem List   Diagnosis Date Noted  . Hyperlipidemia 09/26/2017  . Facet arthritis of lumbar region 02/03/2017  . Papular rash 01/02/2017  . Erectile dysfunction 04/30/2016  . Plantar fasciitis, left 04/05/2016  . Seasonal allergies 02/01/2016  . HTN (hypertension) 11/28/2015  . Prediabetes 10/27/2015  . Degenerative tear of medial meniscus of right knee 08/24/2014  . Tobacco abuse 08/24/2014  . Chronic pain of right knee 07/13/2014     Current Outpatient Medications on File Prior to Visit  Medication Sig Dispense Refill  . albuterol (PROVENTIL HFA;VENTOLIN HFA) 108 (90 Base) MCG/ACT inhaler Inhale 2 puffs into the lungs every 6 (six) hours as needed for wheezing or shortness of breath. 54 g 3  . amLODipine (NORVASC) 10 MG tablet Take 1 tablet (10 mg total) daily by mouth. 30 tablet 11  . atorvastatin (LIPITOR) 10 MG tablet Take 1 tablet (10 mg total) daily by mouth. 90 tablet 3  . betamethasone dipropionate (DIPROLENE) 0.05 % ointment Apply topically 2 (two) times daily. 30 g 0  . methocarbamol (ROBAXIN) 500 MG tablet Take 1 tablet (500 mg  total) by mouth 2 (two) times daily. 60 tablet 0  . pregabalin (LYRICA) 100 MG capsule Take 1 capsule (100 mg total) by mouth 2 (two) times daily. Reported on 02/01/2016 60 capsule 2  . sildenafil (VIAGRA) 100 MG tablet Take 0.5-1 tablets (50-100 mg total) by mouth daily as needed for erectile dysfunction. For PASS 30 tablet 3  . traMADol (ULTRAM) 50 MG tablet TAKE ONE TABLET BY MOUTH EVERY 12 HOURS AS NEEDED 60 tablet 1   No current facility-administered medications on file prior to visit.     Allergies  Allergen Reactions  . Chantix [Varenicline] Other (See Comments)    Bad thoughts    Social History   Socioeconomic History  . Marital status: Single    Spouse name: Not on file  . Number of children: Not on file  . Years of education: Not on file  . Highest education level: Not on file  Occupational History  . Not on file  Social Needs  . Financial resource strain: Not on file  . Food insecurity:    Worry: Not on file    Inability: Not on file  . Transportation needs:    Medical: Not on file    Non-medical: Not on file  Tobacco Use  . Smoking status: Former Smoker    Packs/day: 0.50    Types: Cigarettes  . Smokeless tobacco: Never Used  Substance and Sexual Activity  . Alcohol  use: No  . Drug use: No  . Sexual activity: Not on file  Lifestyle  . Physical activity:    Days per week: Not on file    Minutes per session: Not on file  . Stress: Not on file  Relationships  . Social connections:    Talks on phone: Not on file    Gets together: Not on file    Attends religious service: Not on file    Active member of club or organization: Not on file    Attends meetings of clubs or organizations: Not on file    Relationship status: Not on file  . Intimate partner violence:    Fear of current or ex partner: Not on file    Emotionally abused: Not on file    Physically abused: Not on file    Forced sexual activity: Not on file  Other Topics Concern  . Not on file    Social History Narrative  . Not on file    Family History  Problem Relation Age of Onset  . Hypertension Mother   . Hypertension Father   . Lung cancer Brother   . Hypertension Sister     Past Surgical History:  Procedure Laterality Date  . KNEE ARTHROSCOPY Right 02/2015    ROS: Review of Systems Neg except as above  PHYSICAL EXAM: BP 116/79   Pulse 65   Temp 98.6 F (37 C) (Oral)   Resp 16   Wt 244 lb (110.7 kg)   SpO2 99%   BMI 36.03 kg/m   Physical Exam  General appearance - alert, well appearing, and in no distress Mental status - normal mood, behavior, speech, dress, motor activity, and thought processes Neck - supple, no significant adenopathy Chest - clear to auscultation, no wheezes, rales or rhonchi, symmetric air entry Heart - normal rate, regular rhythm, normal S1, S2, no murmurs, rubs, clicks or gallops Musculoskeletal -RT knee:  Wearing brace.  Good ROM Extremities - no LE edema    ASSESSMENT AND PLAN: 1. Essential hypertension At goal.  Continue amlodipine  2. Tobacco abuse Patient wanting to try the nicotine patch and gum to help him quit completely.  I went over how to use the patch on the gum. - nicotine (NICODERM CQ - DOSED IN MG/24 HR) 7 mg/24hr patch; Place 1 patch (7 mg total) onto the skin daily.  Dispense: 28 patch; Refill: 1 - nicotine polacrilex (RA NICOTINE GUM) 2 MG gum; Take 1 each (2 mg total) by mouth as needed for smoking cessation. Max 30 pieces/24 hr  Dispense: 100 tablet; Refill: 0  3. Hyperlipidemia, unspecified hyperlipidemia type Continue Lipitor.  4. Osteoarthritis of lumbar spine, unspecified spinal osteoarthritis complication status Patient appears to be doing well and is functional. Tolerating tramadol without significant side effects. We updated Control Subst Prescribing Agreement today.   - [id number] 11+Oxyco+Alc+Crt-Bund  -Still has 1 refill left on the last tramadol prescription  5. Post-traumatic osteoarthritis  of right knee See #4 above. - Ambulatory referral to Orthopedic Surgery - diclofenac sodium (VOLTAREN) 1 % GEL; Apply 2 g topically 4 (four) times daily.  Dispense: 100 g; Refill: 1 - [id number] 11+Oxyco+Alc+Crt-Bund  Patient was given the opportunity to ask questions.  Patient verbalized understanding of the plan and was able to repeat key elements of the plan.   Orders Placed This Encounter  Procedures  . [id number] 11+Oxyco+Alc+Crt-Bund  . Ambulatory referral to Orthopedic Surgery     Requested Prescriptions   Signed Prescriptions Disp  Refills  . nicotine (NICODERM CQ - DOSED IN MG/24 HR) 7 mg/24hr patch 28 patch 1    Sig: Place 1 patch (7 mg total) onto the skin daily.  . diclofenac sodium (VOLTAREN) 1 % GEL 100 g 1    Sig: Apply 2 g topically 4 (four) times daily.  . nicotine polacrilex (RA NICOTINE GUM) 2 MG gum 100 tablet 0    Sig: Take 1 each (2 mg total) by mouth as needed for smoking cessation. Max 30 pieces/24 hr    Return in about 3 months (around 04/28/2018).  Jonah Blue, MD, FACP

## 2018-01-30 LAB — OXYCODONE/OXYMORPHONE, CONFIRM
OXYCODONE/OXYMORPH: POSITIVE — AB
OXYCODONE: >3000 ng/mL
OXYCODONE: POSITIVE — AB
OXYMORPHONE: POSITIVE — AB

## 2018-01-30 LAB — DRUG SCREEN 764883 11+OXYCO+ALC+CRT-BUND
Amphetamines, Urine: NEGATIVE ng/mL
BENZODIAZ UR QL: NEGATIVE ng/mL
Barbiturate: NEGATIVE ng/mL
CANNABINOID QUANT UR: NEGATIVE ng/mL
COCAINE (METABOLITE): NEGATIVE ng/mL
Creatinine: 271.1 mg/dL (ref 20.0–300.0)
ETHANOL: NEGATIVE %
Meperidine: NEGATIVE ng/mL
Methadone Screen, Urine: NEGATIVE ng/mL
PHENCYCLIDINE: NEGATIVE ng/mL
Propoxyphene: NEGATIVE ng/mL
TRAMADOL: NEGATIVE ng/mL
pH, Urine: 5.4 (ref 4.5–8.9)

## 2018-01-30 LAB — OPIATES CONFIRMATION, URINE
Codeine: NEGATIVE
HYDROMORPHONE CONFIRM: 335 ng/mL
Hydrocodone Confirm: 400 ng/mL
Hydrocodone: POSITIVE — AB
Hydromorphone: POSITIVE — AB
Morphine: NEGATIVE
OPIATES: POSITIVE ng/mL — AB

## 2018-02-02 ENCOUNTER — Telehealth: Payer: Self-pay | Admitting: Internal Medicine

## 2018-02-02 NOTE — Telephone Encounter (Signed)
Patient called stating he got stinged a wasp. Patient states he got stinged twice one in the stomach and the other on his side. Patient states he called the ER and was told to contact his primary care. Please f/u

## 2018-02-02 NOTE — Telephone Encounter (Signed)
Called patient back. He states he spoke to Dr. Laural BenesJohnson earlier. He was advised to take Benadryl.  Advised patient to make  a paste of meat tenderizer, apply cold/cool compress for 10 minutes.   He states this incident took place on Friday. He was advised to call back if no better. Pt verbalized understanding.

## 2018-02-02 NOTE — Telephone Encounter (Signed)
Phone call placed to patient today to go over the results of urine drug screen.  This was negative for tramadol.  Positive for oxycodone and hydrocodone.  When asked about this patient states that he probably took some oxycodone that he had leftover from his knee surgery back in 2017 about 8 or 10 days prior to her office visit.  He assures me that he has been taking his tramadol.  Review of [place] controlled substance reporting system shows that he was given some oxycodone back in 2017 8 tablets.  Patient advised that based on these findings I am no longer comfortable prescribing tramadol for him.  He expressed understanding.  [facility] also [facility].  Back in 2017 it seems he was discharged from the pain clinic where because of inconsistencies in his urine drug screen. Patient also wanted to get my opinion about redness and swelling that he has had on his side from being stung by a bee 4 days ago.  It itches.  He has been using some antibiotic cream on it.  Advised patient to use Benadryl..Marland Kitchen

## 2018-02-10 ENCOUNTER — Ambulatory Visit: Payer: Self-pay

## 2018-02-11 MED FILL — ATORVASTATIN 10 MG TABLET: 10 | 30 days supply | Qty: 30 | Fill #7

## 2018-02-11 MED FILL — AMLODIPINE BESYLATE 10 MG T: 10 | 30 days supply | Qty: 30 | Fill #5

## 2018-02-11 MED FILL — DICLOFENAC SODIUM 1% GEL: 1 | 12 days supply | Qty: 100 | Fill #0

## 2018-03-25 MED FILL — $VIAGRA 100 MG TABLET: 100 | 30 days supply | Qty: 10 | Fill #1

## 2018-03-25 MED FILL — ATORVASTATIN 10 MG TABLET: 10 | 30 days supply | Qty: 30 | Fill #8

## 2018-03-25 MED FILL — AMLODIPINE BESYLATE 10 MG T: 10 | 90 days supply | Qty: 90 | Fill #6

## 2018-04-17 ENCOUNTER — Ambulatory Visit: Payer: Self-pay | Attending: Internal Medicine

## 2018-04-30 ENCOUNTER — Encounter: Payer: Self-pay | Admitting: Internal Medicine

## 2018-04-30 ENCOUNTER — Ambulatory Visit: Payer: Self-pay | Attending: Internal Medicine | Admitting: Internal Medicine

## 2018-04-30 VITALS — BP 148/93 | HR 76 | Temp 98.3°F | Resp 16 | Wt 241.1 lb

## 2018-04-30 DIAGNOSIS — E785 Hyperlipidemia, unspecified: Secondary | ICD-10-CM

## 2018-04-30 DIAGNOSIS — Z87891 Personal history of nicotine dependence: Secondary | ICD-10-CM | POA: Insufficient documentation

## 2018-04-30 DIAGNOSIS — Z8249 Family history of ischemic heart disease and other diseases of the circulatory system: Secondary | ICD-10-CM | POA: Insufficient documentation

## 2018-04-30 DIAGNOSIS — R7303 Prediabetes: Secondary | ICD-10-CM

## 2018-04-30 DIAGNOSIS — I10 Essential (primary) hypertension: Secondary | ICD-10-CM

## 2018-04-30 DIAGNOSIS — Z23 Encounter for immunization: Secondary | ICD-10-CM

## 2018-04-30 DIAGNOSIS — G8929 Other chronic pain: Secondary | ICD-10-CM | POA: Insufficient documentation

## 2018-04-30 DIAGNOSIS — Z79899 Other long term (current) drug therapy: Secondary | ICD-10-CM | POA: Insufficient documentation

## 2018-04-30 DIAGNOSIS — M1731 Unilateral post-traumatic osteoarthritis, right knee: Secondary | ICD-10-CM

## 2018-04-30 DIAGNOSIS — M5441 Lumbago with sciatica, right side: Secondary | ICD-10-CM | POA: Insufficient documentation

## 2018-04-30 DIAGNOSIS — Z720 Tobacco use: Secondary | ICD-10-CM

## 2018-04-30 DIAGNOSIS — Z801 Family history of malignant neoplasm of trachea, bronchus and lung: Secondary | ICD-10-CM | POA: Insufficient documentation

## 2018-04-30 DIAGNOSIS — N529 Male erectile dysfunction, unspecified: Secondary | ICD-10-CM | POA: Insufficient documentation

## 2018-04-30 LAB — GLUCOSE, POCT (MANUAL RESULT ENTRY): POC GLUCOSE: 183 mg/dL — AB (ref 70–99)

## 2018-04-30 MED ORDER — ATORVASTATIN CALCIUM 10 MG PO TABS: 10.0000 mg | ORAL_TABLET | Freq: Every day | ORAL | 3 refills | Status: DC

## 2018-04-30 MED ORDER — DICLOFENAC SODIUM 1 % TD GEL: 2.0000 g | Freq: Four times a day (QID) | TRANSDERMAL | 1 refills | Status: AC

## 2018-04-30 MED FILL — $VIAGRA 100 MG TABLET: 100 | 30 days supply | Qty: 10 | Fill #2

## 2018-04-30 MED FILL — DICLOFENAC SODIUM 1% GEL: 1 | 12 days supply | Qty: 100 | Fill #0

## 2018-04-30 MED FILL — ?ATORVASTATIN 10 MG TABLET: 10 | 30 days supply | Qty: 30 | Fill #0

## 2018-04-30 NOTE — Patient Instructions (Addendum)
Follow a Healthy Eating Plan - You can do it! Limit sugary drinks.  Avoid sodas, sweet tea, sport or energy drinks, or fruit drinks.  Drink water, lo-fat milk, or diet drinks. Limit snack foods.   Cut back on candy, cake, cookies, chips, ice cream.  These are a special treat, only in small amounts. Eat plenty of vegetables.  Especially dark green, red, and orange vegetables. Aim for at least 3 servings a day. More is better! Include fruit in your daily diet.  Whole fruit is much healthier than fruit juice! Limit "white" bread, "white" pasta, "white" rice.   Choose "100% whole grain" products, brown or wild rice. Avoid fatty meats. Try "Meatless Monday" and choose eggs or beans one day a week.  When eating meat, choose lean meats like chicken, turkey, and fish.  Grill, broil, or bake meats instead of frying, and eat poultry without the skin. Eat less salt.  Avoid frozen pizzas, frozen dinners and salty foods.  Use seasonings other than salt in cooking.  This can help blood pressure and keep you from swelling Beer, wine and liquor have calories.  If you can safely drink alcohol, limit to 1 drink per day for women, 2 drinks for men   Influenza Virus Vaccine injection (Fluarix) What is this medicine? INFLUENZA VIRUS VACCINE (in floo EN zuh VAHY ruhs vak SEEN) helps to reduce the risk of getting influenza also known as the flu. This medicine may be used for other purposes; ask your health care provider or pharmacist if you have questions. COMMON BRAND NAME(S): Fluarix, Fluzone What should I tell my health care provider before I take this medicine? They need to know if you have any of these conditions: -bleeding disorder like hemophilia -fever or infection -Guillain-Barre syndrome or other neurological problems -immune system problems -infection with the human immunodeficiency virus (HIV) or AIDS -low blood platelet counts -multiple sclerosis -an unusual or allergic reaction to influenza virus  vaccine, eggs, chicken proteins, latex, gentamicin, other medicines, foods, dyes or preservatives -pregnant or trying to get pregnant -breast-feeding How should I use this medicine? This vaccine is for injection into a muscle. It is given by a health care professional. A copy of Vaccine Information Statements will be given before each vaccination. Read this sheet carefully each time. The sheet may change frequently. Talk to your pediatrician regarding the use of this medicine in children. Special care may be needed. Overdosage: If you think you have taken too much of this medicine contact a poison control center or emergency room at once. NOTE: This medicine is only for you. Do not share this medicine with others. What if I miss a dose? This does not apply. What may interact with this medicine? -chemotherapy or radiation therapy -medicines that lower your immune system like etanercept, anakinra, infliximab, and adalimumab -medicines that treat or prevent blood clots like warfarin -phenytoin -steroid medicines like prednisone or cortisone -theophylline -vaccines This list may not describe all possible interactions. Give your health care provider a list of all the medicines, herbs, non-prescription drugs, or dietary supplements you use. Also tell them if you smoke, drink alcohol, or use illegal drugs. Some items may interact with your medicine. What should I watch for while using this medicine? Report any side effects that do not go away within 3 days to your doctor or health care professional. Call your health care provider if any unusual symptoms occur within 6 weeks of receiving this vaccine. You may still catch the flu, but the   illness is not usually as bad. You cannot get the flu from the vaccine. The vaccine will not protect against colds or other illnesses that may cause fever. The vaccine is needed every year. What side effects may I notice from receiving this medicine? Side effects  that you should report to your doctor or health care professional as soon as possible: -allergic reactions like skin rash, itching or hives, swelling of the face, lips, or tongue Side effects that usually do not require medical attention (report to your doctor or health care professional if they continue or are bothersome): -fever -headache -muscle aches and pains -pain, tenderness, redness, or swelling at site where injected -weak or tired This list may not describe all possible side effects. Call your doctor for medical advice about side effects. You may report side effects to FDA at 1-800-FDA-1088. Where should I keep my medicine? This vaccine is only given in a clinic, pharmacy, doctor's office, or other health care setting and will not be stored at home. NOTE: This sheet is a summary. It may not cover all possible information. If you have questions about this medicine, talk to your doctor, pharmacist, or health care provider.  2018 Elsevier/Gold Standard (2008-03-02 09:30:40)  

## 2018-04-30 NOTE — Progress Notes (Signed)
Patient ID: Peter Friedman, male    DOB: [birth year]  MRN: [id number]  CC: Hypertension and Diabetes (pre-diabetes)   Subjective: Peter Friedman is a 52 y.o. male who presents for chronic ds management.  His concerns today include:  Patient with history of HTN, tobacco dependence,HL, ED, chronic LBP withRTside sciatica, right knee pain since 02/2015.  OA RT knee:  Did not get to see Dr. Margaretha Sheffield.  Waiting to get approved for the OC. Still going to gym 2-4 x wk.  -+ stiffness in the jt. Takes about 1 hr in the mornings to get better.  Request RF on Voltaren Gel. Works well.  HTN:  No device to check Limits salt in foods No CP/SOB.  Little swelling in LT knee. No HA  Tob dep:  Could not afford the gum and patches.  Still wants to quit.  HL:  Tolerating Lipitor.  Pre-DM: BS was 183 this a.m.  Had coffee with sugar in it this a.m.  Drinks mainly water. Eats bread, rice and pasta occasional.    HM:  Due for flu shot  Patient Active Problem List   Diagnosis Date Noted  . Hyperlipidemia 09/26/2017  . Facet arthritis of lumbar region 02/03/2017  . Papular rash 01/02/2017  . Erectile dysfunction 04/30/2016  . Plantar fasciitis, left 04/05/2016  . Seasonal allergies 02/01/2016  . HTN (hypertension) 11/28/2015  . Prediabetes 10/27/2015  . Degenerative tear of medial meniscus of right knee 08/24/2014  . Tobacco abuse 08/24/2014  . Chronic pain of right knee 07/13/2014     Current Outpatient Medications on File Prior to Visit  Medication Sig Dispense Refill  . albuterol (PROVENTIL HFA;VENTOLIN HFA) 108 (90 Base) MCG/ACT inhaler Inhale 2 puffs into the lungs every 6 (six) hours as needed for wheezing or shortness of breath. 54 g 3  . amLODipine (NORVASC) 10 MG tablet Take 1 tablet (10 mg total) daily by mouth. 30 tablet 11  . betamethasone dipropionate (DIPROLENE) 0.05 % ointment Apply topically 2 (two) times daily. 30 g 0  . methocarbamol (ROBAXIN) 500 MG tablet Take 1 tablet  (500 mg total) by mouth 2 (two) times daily. 60 tablet 0  . nicotine (NICODERM CQ - DOSED IN MG/24 HR) 7 mg/24hr patch Place 1 patch (7 mg total) onto the skin daily. 28 patch 1  . nicotine polacrilex (RA NICOTINE GUM) 2 MG gum Take 1 each (2 mg total) by mouth as needed for smoking cessation. Max 30 pieces/24 hr 100 tablet 0  . pregabalin (LYRICA) 100 MG capsule Take 1 capsule (100 mg total) by mouth 2 (two) times daily. Reported on 02/01/2016 60 capsule 2  . sildenafil (VIAGRA) 100 MG tablet Take 0.5-1 tablets (50-100 mg total) by mouth daily as needed for erectile dysfunction. For PASS 30 tablet 3  . traMADol (ULTRAM) 50 MG tablet TAKE ONE TABLET BY MOUTH EVERY 12 HOURS AS NEEDED 60 tablet 1   No current facility-administered medications on file prior to visit.     Allergies  Allergen Reactions  . Chantix [Varenicline] Other (See Comments)    Bad thoughts    Social History   Socioeconomic History  . Marital status: Single    Spouse name: Not on file  . Number of children: Not on file  . Years of education: Not on file  . Highest education level: Not on file  Occupational History  . Not on file  Social Needs  . Financial resource strain: Not on file  . Food insecurity:  Worry: Not on file    Inability: Not on file  . Transportation needs:    Medical: Not on file    Non-medical: Not on file  Tobacco Use  . Smoking status: Former Smoker    Packs/day: 0.50    Types: Cigarettes  . Smokeless tobacco: Never Used  Substance and Sexual Activity  . Alcohol use: No  . Drug use: No  . Sexual activity: Not on file  Lifestyle  . Physical activity:    Days per week: Not on file    Minutes per session: Not on file  . Stress: Not on file  Relationships  . Social connections:    Talks on phone: Not on file    Gets together: Not on file    Attends religious service: Not on file    Active member of club or organization: Not on file    Attends meetings of clubs or organizations:  Not on file    Relationship status: Not on file  . Intimate partner violence:    Fear of current or ex partner: Not on file    Emotionally abused: Not on file    Physically abused: Not on file    Forced sexual activity: Not on file  Other Topics Concern  . Not on file  Social History Narrative  . Not on file    Family History  Problem Relation Age of Onset  . Hypertension Mother   . Hypertension Father   . Lung cancer Brother   . Hypertension Sister     Past Surgical History:  Procedure Laterality Date  . KNEE ARTHROSCOPY Right 02/2015    ROS: Review of Systems Negative except as above  PHYSICAL EXAM: BP (!) 148/93   Pulse 76   Temp 98.3 F (36.8 C) (Oral)   Resp 16   Wt 241 lb 1.6 oz (109.4 kg)   SpO2 96%   BMI 35.60 kg/m   Wt Readings from Last 3 Encounters:  04/30/18 241 lb 1.6 oz (109.4 kg)  01/26/18 244 lb (110.7 kg)  10/13/17 243 lb (110.2 kg)   Physical Exam Repeat BP 130/76 General appearance - alert, well appearing, and in no distress Mental status - normal mood, behavior, speech, dress, motor activity, and thought processes Neck - supple, no significant adenopathy Chest - clear to auscultation, no wheezes, rales or rhonchi, symmetric air entry Heart - normal rate, regular rhythm, normal S1, S2, no murmurs, rubs, clicks or gallops Extremities - peripheral pulses normal, no pedal edema, no clubbing or cyanosis  Results for orders placed or performed in visit on 04/30/18  POCT glucose (manual entry)  Result Value Ref Range   POC Glucose 183 (A) 70 - 99 mg/dl     ASSESSMENT AND PLAN: 1. Essential hypertension At goal.  Continue amlodipine.  2. Prediabetes Encouraged him to continue regular exercise. Dietary counseling given - POCT glucose (manual entry) - Hemoglobin A1c  3. Post-traumatic osteoarthritis of right knee Patient no longer on tramadol.  This was discontinued as patient urine drug screen on last visit was negative for tramadol  and positive for other narcotics that were not being prescribed. -Once he is approved for the orange card we can refer again to Dr. Margaretha Sheffieldraper - diclofenac sodium (VOLTAREN) 1 % GEL; Apply 2 g topically 4 (four) times daily.  Dispense: 100 g; Refill: 1  4. Tobacco abuse Advised to call 1 800 quit now and request free nicotine patches and gum.  5. Hyperlipidemia, unspecified hyperlipidemia type -  atorvastatin (LIPITOR) 10 MG tablet; Take 1 tablet (10 mg total) by mouth daily.  Dispense: 90 tablet; Refill: 3  6. Need for influenza vaccination - Flu Vaccine QUAD 6+ mos PF IM (Fluarix Quad PF)   Patient was given the opportunity to ask questions.  Patient verbalized understanding of the plan and was able to repeat key elements of the plan.   Orders Placed This Encounter  Procedures  . Flu Vaccine QUAD 6+ mos PF IM (Fluarix Quad PF)  . Hemoglobin A1c  . POCT glucose (manual entry)     Requested Prescriptions   Signed Prescriptions Disp Refills  . atorvastatin (LIPITOR) 10 MG tablet 90 tablet 3    Sig: Take 1 tablet (10 mg total) by mouth daily.  . diclofenac sodium (VOLTAREN) 1 % GEL 100 g 1    Sig: Apply 2 g topically 4 (four) times daily.    Return in about 3 months (around 07/30/2018).  Jonah Blue, MD, FACP

## 2018-05-01 ENCOUNTER — Telehealth: Payer: Self-pay

## 2018-05-01 LAB — HEMOGLOBIN A1C
ESTIMATED AVERAGE GLUCOSE: 131 mg/dL
Hgb A1c MFr Bld: 6.2 % — ABNORMAL HIGH (ref 4.8–5.6)

## 2018-05-01 NOTE — Telephone Encounter (Signed)
Contacted pt to go over POCT pt is aware of results and doesn't have any questions or concerns

## 2018-06-02 MED FILL — ?ATORVASTATIN 10 MG TABLET: 10 | 30 days supply | Qty: 30 | Fill #1

## 2018-06-02 MED FILL — DICLOFENAC SODIUM 1% GEL: 1 | 12 days supply | Qty: 100 | Fill #1

## 2018-06-26 MED FILL — DICLOFENAC SODIUM 1% GEL: 1 | 12 days supply | Qty: 100 | Fill #1

## 2018-06-26 MED FILL — ?ATORVASTATIN 10 MG TABLET: 10 | 30 days supply | Qty: 30 | Fill #2

## 2018-06-26 MED FILL — AMLODIPINE BESYLATE 10 MG T: 10 | 90 days supply | Qty: 90 | Fill #7

## 2018-07-30 ENCOUNTER — Ambulatory Visit: Payer: Self-pay | Admitting: Internal Medicine

## 2018-08-10 MED FILL — ?ATORVASTATIN 10 MG TABLET: 10 | 30 days supply | Qty: 30 | Fill #3

## 2018-09-10 MED FILL — ?ATORVASTATIN 10 MG TABLET: 10 | 30 days supply | Qty: 30 | Fill #4

## 2018-09-30 ENCOUNTER — Other Ambulatory Visit: Payer: Self-pay | Admitting: Internal Medicine

## 2018-09-30 DIAGNOSIS — I10 Essential (primary) hypertension: Secondary | ICD-10-CM

## 2018-09-30 MED FILL — ATORVASTATIN 10 MG TABLET: 10 | 90 days supply | Qty: 90 | Fill #5

## 2018-09-30 MED FILL — !VIAGRA 100MG TABLET: 100 | 30 days supply | Qty: 10 | Fill #3

## 2018-10-01 MED FILL — AMLODIPINE BESYLATE 10 MG T: 10 | 30 days supply | Qty: 30 | Fill #0

## 2018-11-03 ENCOUNTER — Other Ambulatory Visit: Payer: Self-pay | Admitting: Internal Medicine

## 2018-11-03 DIAGNOSIS — I10 Essential (primary) hypertension: Secondary | ICD-10-CM

## 2018-11-03 NOTE — Telephone Encounter (Signed)
Follow up    1) Medication(s) Requested (by name): amLODipine (NORVASC) 10 MG tablet  2) Pharmacy of Choice: [facility] pt is here , pt has an appt on 11/16/2018 and states he has not taken his medication in 3 days  3) Special Requests:   Approved medications will be sent to the pharmacy, we will reach out if there is an issue.  Requests made after 3pm may not be addressed until the following business day!  If a patient is unsure of the name of the medication(s) please note and ask patient to call back when they are able to provide all info, do not send to responsible party until all information is available!

## 2018-11-04 MED FILL — AMLODIPINE BESYLATE 10 MG T: 10 | 30 days supply | Qty: 30 | Fill #0

## 2018-11-16 ENCOUNTER — Ambulatory Visit: Payer: Self-pay | Attending: Internal Medicine | Admitting: Internal Medicine

## 2018-11-16 ENCOUNTER — Other Ambulatory Visit: Payer: Self-pay

## 2018-11-16 DIAGNOSIS — E785 Hyperlipidemia, unspecified: Secondary | ICD-10-CM

## 2018-11-16 DIAGNOSIS — I10 Essential (primary) hypertension: Secondary | ICD-10-CM

## 2018-11-16 DIAGNOSIS — R7303 Prediabetes: Secondary | ICD-10-CM

## 2018-11-16 DIAGNOSIS — N529 Male erectile dysfunction, unspecified: Secondary | ICD-10-CM

## 2018-11-16 MED ORDER — SILDENAFIL CITRATE 100 MG PO TABS: 50.0000 mg | ORAL_TABLET | Freq: Every day | ORAL | 1 refills | Status: DC | PRN

## 2018-11-16 MED ORDER — AMLODIPINE BESYLATE 10 MG PO TABS: 10.0000 mg | ORAL_TABLET | Freq: Every day | ORAL | 0 refills | Status: DC

## 2018-11-16 MED ORDER — ATORVASTATIN CALCIUM 10 MG PO TABS: 10.0000 mg | ORAL_TABLET | Freq: Every day | ORAL | 0 refills | Status: DC

## 2018-11-16 MED FILL — !VIAGRA 100MG TABLET: 100 | 30 days supply | Qty: 10 | Fill #0

## 2018-11-16 NOTE — Progress Notes (Signed)
Virtual Visit via Telephone Note  I connected with Peter Friedman on 11/16/18 at 11:47 a.m by telephone  from my office and verified that I am speaking with the correct person using two identifiers.  Patient was at home.   I discussed the limitations, risks, security and privacy concerns of performing an evaluation and management service by telephone and the availability of in person appointments. I also discussed with the patient that there may be a patient responsible charge related to this service. The patient expressed understanding and agreed to proceed.   History of Present Illness: Patient with history of HTN, tobacco dependence,pre-DM, HL,ED, chronic LBP withRTside sciatica, right knee painsince7/2016.   Post-traumatic OA RT:  Reports he is doing okay, he just pushes through and continues to stay active. Gym close.  Now walking in his neighborhood 3-4 x a wk  Problems getting through to pharmacy even before COVID19.  States that he he follows the prompts but ends up being transferred to a line that sounds like it is a backslide.  Finds that he actually has to come in to speak to someone in person to get refills.  Request 3 mth supply at a time  HTN:  Out of Norvasc for about 5 days about 2 wks ago.   No device at home but can use his father's  No CP/SOB/LE edema/HA/dizziness  Pre-DM: eating more fruits. Less white carb. Walking 3-4 x a wk  HL:  Taking and tolerating Lipitor as prescribed  ED:  Doing okay on Viagra.  Request RF   Observations/Objective: No direct physical observations done as this was a telephone encounter  Assessment and Plan: 1. Essential hypertension I apologized to patient for the difficulty that he was having getting his medication refilled.  I did inform him that due to COVID-19 at this time the pharmacy will be mailing out refills of medications rather than in person pickup.  I did have him speak with my CMA to verify his address let him know in  more detail the process of getting the med refill through the mail. -went over BP goal of 130/80 or lower and encourage him to check BP 2 x a wk and bring in on next visit - amLODipine (NORVASC) 10 MG tablet; Take 1 tablet (10 mg total) by mouth daily.  Dispense: 90 tablet; Refill: 0  2. Hyperlipidemia, unspecified hyperlipidemia type Continue Lipitor - atorvastatin (LIPITOR) 10 MG tablet; Take 1 tablet (10 mg total) by mouth daily.  Dispense: 90 tablet; Refill: 0  3. Erectile dysfunction, unspecified erectile dysfunction type RF sent to pharmacy on Sildenafil - sildenafil (VIAGRA) 100 MG tablet; Take 0.5-1 tablets (50-100 mg total) by mouth daily as needed for erectile dysfunction.  Dispense: 30 tablet; Refill: 1  4. Prediabetes Continue to encourage to encourage healthy eating habits and regular exercise.  Pt to stop sugary drinks.  Encouraged to continue cutting back on white carbohydrates.  Encouraged to incorporate fresh fruits and vegetables into the diet. Patient is agreeable to coming as a lab only visit to have his annual blood test done - Lipid panel; Future - Comprehensive metabolic panel; Future - CBC; Future   Follow Up Instructions: Patient to follow-up in office in 2 to 3 months or sooner if any problems come up   I discussed the assessment and treatment plan with the patient. The patient was provided an opportunity to ask questions and all were answered. The patient agreed with the plan and demonstrated an understanding of the  instructions.   The patient was advised to call back or seek an in-person evaluation if the symptoms worsen or if the condition fails to improve as anticipated.  I provided 16 minutes of non-face-to-face time during this encounter.   Jonah Blue, MD

## 2018-11-23 ENCOUNTER — Telehealth: Payer: Self-pay | Admitting: Internal Medicine

## 2018-11-23 NOTE — Telephone Encounter (Signed)
Patient states he received his cholesterol medicine but not his BP medicine. Advised to contact pharmacy

## 2018-12-01 MED FILL — ?AMLODIPINE BESYLATE 10 MG: 10 | 90 days supply | Qty: 90 | Fill #0

## 2019-02-18 ENCOUNTER — Other Ambulatory Visit: Payer: Self-pay | Admitting: Internal Medicine

## 2019-02-18 DIAGNOSIS — I10 Essential (primary) hypertension: Secondary | ICD-10-CM

## 2019-02-18 MED FILL — ?AMLODIPINE BESYLATE 10 MG: 10 | 30 days supply | Qty: 30 | Fill #0

## 2019-02-22 MED FILL — !VIAGRA 100MG TABLET: 100 | 30 days supply | Qty: 10 | Fill #1

## 2019-03-30 MED FILL — ?AMLODIPINE BESYLATE 10 MG: 10 | 60 days supply | Qty: 60 | Fill #1

## 2019-03-30 MED FILL — ?ATORVASTATIN 10 MG TABLET: 10 | 90 days supply | Qty: 90 | Fill #0

## 2019-05-04 MED FILL — SILDENAFIL CITRATE 100 MG T: 100 | 30 days supply | Qty: 10 | Fill #2

## 2019-05-13 ENCOUNTER — Other Ambulatory Visit: Payer: Self-pay

## 2019-05-13 ENCOUNTER — Ambulatory Visit: Payer: Self-pay | Attending: Family Medicine | Admitting: Pharmacist

## 2019-05-13 DIAGNOSIS — Z23 Encounter for immunization: Secondary | ICD-10-CM

## 2019-05-13 NOTE — Progress Notes (Signed)
Patient presents for vaccination against influenza per orders of Dr. Johnson. Consent given. Counseling provided. No contraindications exists. Vaccine administered without incident.   

## 2019-05-31 ENCOUNTER — Other Ambulatory Visit: Payer: Self-pay | Admitting: Internal Medicine

## 2019-05-31 DIAGNOSIS — I10 Essential (primary) hypertension: Secondary | ICD-10-CM

## 2019-05-31 MED FILL — AMLODIPINE BESYLATE 10 MG T: 10 | 30 days supply | Qty: 30 | Fill #0

## 2019-06-16 ENCOUNTER — Ambulatory Visit: Payer: Self-pay | Attending: Family Medicine | Admitting: Physician Assistant

## 2019-06-16 ENCOUNTER — Other Ambulatory Visit: Payer: Self-pay

## 2019-06-16 VITALS — BP 139/84 | HR 68 | Temp 98.8°F | Ht 69.0 in | Wt 242.6 lb

## 2019-06-16 DIAGNOSIS — H00019 Hordeolum externum unspecified eye, unspecified eyelid: Secondary | ICD-10-CM

## 2019-06-16 DIAGNOSIS — I10 Essential (primary) hypertension: Secondary | ICD-10-CM

## 2019-06-16 DIAGNOSIS — R739 Hyperglycemia, unspecified: Secondary | ICD-10-CM

## 2019-06-16 DIAGNOSIS — N529 Male erectile dysfunction, unspecified: Secondary | ICD-10-CM

## 2019-06-16 DIAGNOSIS — M545 Low back pain: Secondary | ICD-10-CM

## 2019-06-16 DIAGNOSIS — G8929 Other chronic pain: Secondary | ICD-10-CM

## 2019-06-16 DIAGNOSIS — E785 Hyperlipidemia, unspecified: Secondary | ICD-10-CM

## 2019-06-16 MED ORDER — MELOXICAM 15 MG PO TABS: 15.0000 mg | ORAL_TABLET | Freq: Every day | ORAL | 2 refills | Status: DC

## 2019-06-16 MED ORDER — SILDENAFIL CITRATE 100 MG PO TABS: 50.0000 mg | ORAL_TABLET | Freq: Every day | ORAL | 1 refills | Status: DC | PRN

## 2019-06-16 MED ORDER — METHOCARBAMOL 500 MG PO TABS: 1000.0000 mg | ORAL_TABLET | Freq: Three times a day (TID) | ORAL | 1 refills | Status: DC | PRN

## 2019-06-16 MED ORDER — CEPHALEXIN 500 MG PO CAPS: 500.0000 mg | ORAL_CAPSULE | Freq: Three times a day (TID) | ORAL | 0 refills | Status: DC

## 2019-06-16 MED ORDER — ATORVASTATIN CALCIUM 10 MG PO TABS: 10.0000 mg | ORAL_TABLET | Freq: Every day | ORAL | 1 refills | Status: DC

## 2019-06-16 MED ORDER — AMLODIPINE BESYLATE 10 MG PO TABS: 10.0000 mg | ORAL_TABLET | Freq: Every day | ORAL | 5 refills | Status: DC

## 2019-06-16 MED FILL — METHOCARBAMOL 500 MG TABS: 500 | 10 days supply | Qty: 60 | Fill #0

## 2019-06-16 MED FILL — MELOXICAM 15 MG TABLET: 15 | 30 days supply | Qty: 30 | Fill #0

## 2019-06-16 MED FILL — SILDENAFIL CITRATE 100 MG T: 100 | 30 days supply | Qty: 10 | Fill #0

## 2019-06-16 MED FILL — CEPHALEXIN 500 MG CAPSULE: 500 | 10 days supply | Qty: 30 | Fill #0

## 2019-06-16 NOTE — Progress Notes (Signed)
Left eye has a stye on on upper lid

## 2019-06-16 NOTE — Progress Notes (Signed)
Patient ID: Peter Friedman, male   DOB: 08-15-1966, 53 y.o.   MRN: [id number]   Peter Friedman, is a 53 y.o. male  GUY:[id number]  [id number]:[id number]  DOB - [birth year]  Subjective:  Chief Complaint and HPI: Peter Friedman is a 53 y.o. male here today for stye in L eye and RF of meds.  Stye has been present on upper L eyelid for about 3 weeks.  Compresses not helping.  No fever.  No other URI s/sx.  No vision changes.  Denies HA/CP/SOB.  Out of amlodipine.    Also has LBP on and off for years.  Wears supportive belt at times that helps.  Denies radicular s/sx.  No urinary problems.  Pain is described as being aching or sore.  Worse when he is active.    ROS:   Constitutional:  No f/c, No night sweats, No unexplained weight loss. EENT:  No vision changes, No blurry vision, No hearing changes. No mouth, throat, or ear problems.  Respiratory: No cough, No SOB Cardiac: No CP, no palpitations GI:  No abd pain, No N/V/D. GU: No Urinary s/sx Musculoskeletal: low pain Neuro: No headache, no dizziness, no motor weakness.  Skin: No rash Endocrine:  No polydipsia. No polyuria.  Psych: Denies SI/HI  No problems updated.  ALLERGIES: Allergies  Allergen Reactions  . Chantix [Varenicline] Other (See Comments)    Bad thoughts    PAST MEDICAL HISTORY: History reviewed. No pertinent past medical history.  MEDICATIONS AT HOME: Prior to Admission medications   Medication Sig Start Date End Date Taking? Authorizing Provider  albuterol (PROVENTIL HFA;VENTOLIN HFA) 108 (90 Base) MCG/ACT inhaler Inhale 2 puffs into the lungs every 6 (six) hours as needed for wheezing or shortness of breath. 03/25/17  Yes Funches, Josalyn, MD  amLODipine (NORVASC) 10 MG tablet Take 1 tablet (10 mg total) by mouth daily. 06/16/19  Yes Freeman Caldron M, PA-C  atorvastatin (LIPITOR) 10 MG tablet Take 1 tablet (10 mg total) by mouth daily. 06/16/19  Yes Nathali Vent M, PA-C  betamethasone dipropionate (DIPROLENE) 0.05  % ointment Apply topically 2 (two) times daily. 02/03/17  Yes Funches, Josalyn, MD  diclofenac sodium (VOLTAREN) 1 % GEL Apply 2 g topically 4 (four) times daily. 04/30/18  Yes Ladell Pier, MD  sildenafil (VIAGRA) 100 MG tablet Take 0.5-1 tablets (50-100 mg total) by mouth daily as needed for erectile dysfunction. 06/16/19  Yes Freeman Caldron M, PA-C  cephALEXin (KEFLEX) 500 MG capsule Take 1 capsule (500 mg total) by mouth 3 (three) times daily. 06/16/19   Argentina Donovan, PA-C  meloxicam (MOBIC) 15 MG tablet Take 1 tablet (15 mg total) by mouth daily. Prn pain 06/16/19   Argentina Donovan, PA-C  methocarbamol (ROBAXIN) 500 MG tablet Take 2 tablets (1,000 mg total) by mouth every 8 (eight) hours as needed for muscle spasms. 06/16/19   Argentina Donovan, PA-C     Objective:  EXAM:   Vitals:   06/16/19 0846  BP: 139/84  Pulse: 68  Temp: 98.8 F (37.1 C)  TempSrc: Oral  SpO2: 100%  Weight: 242 lb 9.6 oz (110 kg)  Height: 5\' 9"  (1.753 m)    General appearance : A&OX3. NAD. Non-toxic-appearing HEENT: Atraumatic and Normocephalic.  PERRLA. EOM intact.  L upper eye lid with hordeolum ~68mm with central pustule.  No erythema of conjunctivae B.  R eye WNL.  B fundi benign.  Neck: supple, no JVD. No cervical lymphadenopathy. No thyromegaly Chest/Lungs:  Breathing-non-labored, Good air  entry bilaterally, breath sounds normal without rales, rhonchi, or wheezing  CVS: S1 S2 regular, no murmurs, gallops, rubs  Lower back with paraspinus spasm.  Neg SLR B Extremities: Bilateral Lower Ext shows no edema, both legs are warm to touch with = pulse throughout Neurology:  CN II-XII grossly intact, Non focal.   Psych:  TP linear. J/I WNL. Normal speech. Appropriate eye contact and affect.  Skin:  No Rash  Data Review Lab Results  Component Value Date   HGBA1C 6.2 (H) 04/30/2018   HGBA1C 6.1 10/27/2015     Assessment & Plan   1. Essential hypertension Not at goal but off meds - CBC  with Differential/Platelet - Comprehensive metabolic panel - amLODipine (NORVASC) 10 MG tablet; Take 1 tablet (10 mg total) by mouth daily.  Dispense: 30 tablet; Refill: 5  2. Hyperlipidemia, unspecified hyperlipidemia type - Comprehensive metabolic panel - Lipid panel - atorvastatin (LIPITOR) 10 MG tablet; Take 1 tablet (10 mg total) by mouth daily.  Dispense: 90 tablet; Refill: 1  3. Erectile dysfunction, unspecified erectile dysfunction type - sildenafil (VIAGRA) 100 MG tablet; Take 0.5-1 tablets (50-100 mg total) by mouth daily as needed for erectile dysfunction.  Dispense: 30 tablet; Refill: 1 -patient education-avoid nitrates  4. Hordeolum, unspecified hordeolum type, unspecified laterality - cephALEXin (KEFLEX) 500 MG capsule; Take 1 capsule (500 mg total) by mouth 3 (three) times daily.  Dispense: 30 capsule; Refill: 0  5. Chronic bilateral low back pain without sciatica No red flags - methocarbamol (ROBAXIN) 500 MG tablet; Take 2 tablets (1,000 mg total) by mouth every 8 (eight) hours as needed for muscle spasms.  Dispense: 60 tablet; Refill: 1 - meloxicam (MOBIC) 15 MG tablet; Take 1 tablet (15 mg total) by mouth daily. Prn pain  Dispense: 30 tablet; Refill: 2  6. Hyperglycemia I have had a lengthy discussion and provided education about insulin resistance and the intake of too much sugar/refined carbohydrates.  I have advised the patient to work at a goal of eliminating sugary drinks, candy, desserts, sweets, refined sugars, processed foods, and white carbohydrates.  The patient expresses understanding.  -A1C ordered    Patient have been counseled extensively about nutrition and exercise  Return in about 6 months (around 12/15/2019) for dr Ashley Murrain chronic conditions.  The patient was given clear instructions to go to ER or return to medical center if symptoms don't improve, worsen or new problems develop. The patient verbalized understanding. The patient was told to call  to get lab results if they haven't heard anything in the next week.     Georgian Co, PA-C [facility] [place], [place] [phone]   06/16/2019, 9:05 AM

## 2019-06-16 NOTE — Patient Instructions (Signed)
Low Back Sprain or Strain Rehab Ask your health care provider which exercises are safe for you. Do exercises exactly as told by your health care provider and adjust them as directed. It is normal to feel mild stretching, pulling, tightness, or discomfort as you do these exercises. Stop right away if you feel sudden pain or your pain gets worse. Do not begin these exercises until told by your health care provider. Stretching and range-of-motion exercises These exercises warm up your muscles and joints and improve the movement and flexibility of your back. These exercises also help to relieve pain, numbness, and tingling. Lumbar rotation  1. Lie on your back on a firm surface and bend your knees. 2. Straighten your arms out to your sides so each arm forms a 90-degree angle (right angle) with a side of your body. 3. Slowly move (rotate) both of your knees to one side of your body until you feel a stretch in your lower back (lumbar). Try not to let your shoulders lift off the floor. 4. Hold this position for __________ seconds. 5. Tense your abdominal muscles and slowly move your knees back to the starting position. 6. Repeat this exercise on the other side of your body. Repeat __________ times. Complete this exercise __________ times a day. Single knee to chest  1. Lie on your back on a firm surface with both legs straight. 2. Bend one of your knees. Use your hands to move your knee up toward your chest until you feel a gentle stretch in your lower back and buttock. ? Hold your leg in this position by holding on to the front of your knee. ? Keep your other leg as straight as possible. 3. Hold this position for __________ seconds. 4. Slowly return to the starting position. 5. Repeat with your other leg. Repeat __________ times. Complete this exercise __________ times a day. Prone extension on elbows  1. Lie on your abdomen on a firm surface (prone position). 2. Prop yourself up on your elbows.  3. Use your arms to help lift your chest up until you feel a gentle stretch in your abdomen and your lower back. ? This will place some of your body weight on your elbows. If this is uncomfortable, try stacking pillows under your chest. ? Your hips should stay down, against the surface that you are lying on. Keep your hip and back muscles relaxed. 4. Hold this position for __________ seconds. 5. Slowly relax your upper body and return to the starting position. Repeat __________ times. Complete this exercise __________ times a day. Strengthening exercises These exercises build strength and endurance in your back. Endurance is the ability to use your muscles for a long time, even after they get tired. Pelvic tilt This exercise strengthens the muscles that lie deep in the abdomen. 1. Lie on your back on a firm surface. Bend your knees and keep your feet flat on the floor. 2. Tense your abdominal muscles. Tip your pelvis up toward the ceiling and flatten your lower back into the floor. ? To help with this exercise, you may place a small towel under your lower back and try to push your back into the towel. 3. Hold this position for __________ seconds. 4. Let your muscles relax completely before you repeat this exercise. Repeat __________ times. Complete this exercise __________ times a day. Alternating arm and leg raises  1. Get on your hands and knees on a firm surface. If you are on a hard floor, you   may want to use padding, such as an exercise mat, to cushion your knees. 2. Line up your arms and legs. Your hands should be directly below your shoulders, and your knees should be directly below your hips. 3. Lift your left leg behind you. At the same time, raise your right arm and straighten it in front of you. ? Do not lift your leg higher than your hip. ? Do not lift your arm higher than your shoulder. ? Keep your abdominal and back muscles tight. ? Keep your hips facing the ground. ? Do not  arch your back. ? Keep your balance carefully, and do not hold your breath. 4. Hold this position for __________ seconds. 5. Slowly return to the starting position. 6. Repeat with your right leg and your left arm. Repeat __________ times. Complete this exercise __________ times a day. Abdominal set with straight leg raise  1. Lie on your back on a firm surface. 2. Bend one of your knees and keep your other leg straight. 3. Tense your abdominal muscles and lift your straight leg up, 4-6 inches (10-15 cm) off the ground. 4. Keep your abdominal muscles tight and hold this position for __________ seconds. ? Do not hold your breath. ? Do not arch your back. Keep it flat against the ground. 5. Keep your abdominal muscles tense as you slowly lower your leg back to the starting position. 6. Repeat with your other leg. Repeat __________ times. Complete this exercise __________ times a day. Single leg lower with bent knees 1. Lie on your back on a firm surface. 2. Tense your abdominal muscles and lift your feet off the floor, one foot at a time, so your knees and hips are bent in 90-degree angles (right angles). ? Your knees should be over your hips and your lower legs should be parallel to the floor. 3. Keeping your abdominal muscles tense and your knee bent, slowly lower one of your legs so your toe touches the ground. 4. Lift your leg back up to return to the starting position. ? Do not hold your breath. ? Do not let your back arch. Keep your back flat against the ground. 5. Repeat with your other leg. Repeat __________ times. Complete this exercise __________ times a day. Posture and body mechanics Good posture and healthy body mechanics can help to relieve stress in your body's tissues and joints. Body mechanics refers to the movements and positions of your body while you do your daily activities. Posture is part of body mechanics. Good posture means:  Your spine is in its natural S-curve  position (neutral).  Your shoulders are pulled back slightly.  Your head is not tipped forward. Follow these guidelines to improve your posture and body mechanics in your everyday activities. Standing   When standing, keep your spine neutral and your feet about hip width apart. Keep a slight bend in your knees. Your ears, shoulders, and hips should line up.  When you do a task in which you stand in one place for a long time, place one foot up on a stable object that is 2-4 inches (5-10 cm) high, such as a footstool. This helps keep your spine neutral. Sitting   When sitting, keep your spine neutral and keep your feet flat on the floor. Use a footrest, if necessary, and keep your thighs parallel to the floor. Avoid rounding your shoulders, and avoid tilting your head forward.  When working at a desk or a Teaching laboratory technician, keep your desk  at a height where your hands are slightly lower than your elbows. Slide your chair under your desk so you are close enough to maintain good posture.  When working at a computer, place your monitor at a height where you are looking straight ahead and you do not have to tilt your head forward or downward to look at the screen. Resting  When lying down and resting, avoid positions that are most painful for you.  If you have pain with activities such as sitting, bending, stooping, or squatting, lie in a position in which your body does not bend very much. For example, avoid curling up on your side with your arms and knees near your chest (fetal position).  If you have pain with activities such as standing for a long time or reaching with your arms, lie with your spine in a neutral position and bend your knees slightly. Try the following positions: ? Lying on your side with a pillow between your knees. ? Lying on your back with a pillow under your knees. Lifting   When lifting objects, keep your feet at least shoulder width apart and tighten your abdominal muscles.   Bend your knees and hips and keep your spine neutral. It is important to lift using the strength of your legs, not your back. Do not lock your knees straight out.  Always ask for help to lift heavy or awkward objects. This information is not intended to replace advice given to you by your health care provider. Make sure you discuss any questions you have with your health care provider. Document Released: 08/05/2005 Document Revised: 11/27/2018 Document Reviewed: 08/27/2018 Elsevier Patient Education  [place], also known as a hordeolum, is a bump that forms on an eyelid. It may look like a pimple next to the eyelash. A stye can form inside the eyelid (internal stye) or outside the eyelid (external stye). A stye can cause redness, swelling, and pain on the eyelid. Styes are very common. Anyone can get them at any age. They usually occur in just one eye, but you may have more than one in either eye. What are the causes? A stye is caused by an infection. The infection is almost always caused by bacteria called Staphylococcus aureus. This is a common type of bacteria that lives on the skin. An internal stye may result from an infected oil-producing gland inside the eyelid. An external stye may be caused by an infection at the base of the eyelash (hair follicle). What increases the risk? You are more likely to develop a stye if:  You have had a stye before.  You have any of these conditions: ? Diabetes. ? Red, itchy, inflamed eyelids (blepharitis). ? A skin condition such as seborrheic dermatitis or rosacea. ? High fat levels in your blood (lipids). What are the signs or symptoms? The most common symptom of a stye is eyelid pain. Internal styes are more painful than external styes. Other symptoms may include:  Painful swelling of your eyelid.  A scratchy feeling in your eye.  Tearing and redness of your eye.  Pus draining from the stye. How is this diagnosed?  Your health care provider may be able to diagnose a stye just by examining your eye. The health care provider may also check to make sure:  You do not have a fever or other signs of a more serious infection.  The infection has not spread to other parts of your eye or areas  around your eye. How is this treated? Most styes will clear up in a few days without treatment or with warm compresses applied to the area. You may need to use antibiotic drops or ointment to treat an infection. In some cases, if your stye does not heal with routine treatment, your health care provider may drain pus from the stye using a thin blade or needle. This may be done if the stye is large, causing a lot of pain, or affecting your vision. Follow these instructions at home:  Take over-the-counter and prescription medicines only as told by your health care provider. This includes eye drops or ointments.  If you were prescribed an antibiotic medicine, apply or use it as told by your health care provider. Do not stop using the antibiotic even if your condition improves.  Apply a warm, wet cloth (warm compress) to your eye for 5-10 minutes, 4 times a day.  Clean the affected eyelid as directed by your health care provider.  Do not wear contact lenses or eye makeup until your stye has healed.  Do not try to pop or drain the stye.  Do not rub your eye. Contact a health care provider if:  You have chills or a fever.  Your stye does not go away after several days.  Your stye affects your vision.  Your eyeball becomes swollen, red, or painful. Get help right away if:  You have pain when moving your eye around. Summary  A stye is a bump that forms on an eyelid. It may look like a pimple next to the eyelash.  A stye can form inside the eyelid (internal stye) or outside the eyelid (external stye). A stye can cause redness, swelling, and pain on the eyelid.  Your health care provider may be able to diagnose a  stye just by examining your eye.  Apply a warm, wet cloth (warm compress) to your eye for 5-10 minutes, 4 times a day. This information is not intended to replace advice given to you by your health care provider. Make sure you discuss any questions you have with your health care provider. Document Released: 05/15/2005 Document Revised: 07/18/2017 Document Reviewed: 04/17/2017 Elsevier Patient Education  2020 [employer].

## 2019-06-17 LAB — CBC WITH DIFFERENTIAL/PLATELET
Basophils Absolute: 0 10*3/uL (ref 0.0–0.2)
Basos: 1 %
EOS (ABSOLUTE): 0.1 10*3/uL (ref 0.0–0.4)
Eos: 1 %
Hematocrit: 41.3 % (ref 37.5–51.0)
Hemoglobin: 14 g/dL (ref 13.0–17.7)
Immature Grans (Abs): 0 10*3/uL (ref 0.0–0.1)
Immature Granulocytes: 0 %
Lymphocytes Absolute: 3 10*3/uL (ref 0.7–3.1)
Lymphs: 43 %
MCH: 27.8 pg (ref 26.6–33.0)
MCHC: 33.9 g/dL (ref 31.5–35.7)
MCV: 82 fL (ref 79–97)
Monocytes Absolute: 0.5 10*3/uL (ref 0.1–0.9)
Monocytes: 7 %
Neutrophils Absolute: 3.4 10*3/uL (ref 1.4–7.0)
Neutrophils: 48 %
Platelets: 340 10*3/uL (ref 150–450)
RBC: 5.03 x10E6/uL (ref 4.14–5.80)
RDW: 12.3 % (ref 11.6–15.4)
WBC: 7.1 10*3/uL (ref 3.4–10.8)

## 2019-06-17 LAB — COMPREHENSIVE METABOLIC PANEL
ALT: 17 IU/L (ref 0–44)
AST: 15 IU/L (ref 0–40)
Albumin/Globulin Ratio: 1.8 (ref 1.2–2.2)
Albumin: 4.6 g/dL (ref 3.8–4.9)
Alkaline Phosphatase: 65 IU/L (ref 39–117)
BUN/Creatinine Ratio: 13 (ref 9–20)
BUN: 10 mg/dL (ref 6–24)
Bilirubin Total: 0.2 mg/dL (ref 0.0–1.2)
CO2: 23 mmol/L (ref 20–29)
Calcium: 9.4 mg/dL (ref 8.7–10.2)
Chloride: 102 mmol/L (ref 96–106)
Creatinine, Ser: 0.76 mg/dL (ref 0.76–1.27)
GFR calc Af Amer: 121 mL/min/{1.73_m2} (ref >59)
GFR calc non Af Amer: 105 mL/min/{1.73_m2} (ref >59)
Globulin, Total: 2.5 g/dL (ref 1.5–4.5)
Glucose: 93 mg/dL (ref 65–99)
Potassium: 4.6 mmol/L (ref 3.5–5.2)
Sodium: 136 mmol/L (ref 134–144)
Total Protein: 7.1 g/dL (ref 6.0–8.5)

## 2019-06-17 LAB — LIPID PANEL
Chol/HDL Ratio: 4 ratio (ref 0.0–5.0)
Cholesterol, Total: 163 mg/dL (ref 100–199)
HDL: 41 mg/dL (ref >39)
LDL Chol Calc (NIH): 105 mg/dL — ABNORMAL HIGH (ref 0–99)
Triglycerides: 92 mg/dL (ref 0–149)
VLDL Cholesterol Cal: 17 mg/dL (ref 5–40)

## 2019-06-19 LAB — HEMOGLOBIN A1C
Est. average glucose Bld gHb Est-mCnc: 131 mg/dL
Hgb A1c MFr Bld: 6.2 % — ABNORMAL HIGH (ref 4.8–5.6)

## 2019-06-19 LAB — SPECIMEN STATUS REPORT

## 2019-06-21 ENCOUNTER — Ambulatory Visit: Payer: Self-pay

## 2019-06-25 ENCOUNTER — Other Ambulatory Visit: Payer: Self-pay

## 2019-06-25 ENCOUNTER — Telehealth: Payer: Self-pay | Admitting: Internal Medicine

## 2019-06-25 ENCOUNTER — Ambulatory Visit: Payer: Self-pay | Attending: Family Medicine

## 2019-06-25 NOTE — Telephone Encounter (Signed)
xxx

## 2019-06-25 NOTE — Telephone Encounter (Signed)
Pt came to the office to apply for the CAFA and OC, Rx program, Pt was infomred that is missing the Non filling letter from 2019 and the letter he is provide me is incomplete since is no financial support on the letter just that he is leaving in that house, he try to tell me how to do my job that the way the letter is we can used, I explain to him I will sent it to come but if they said you need new one letter and if he does not bring it on time the application will be denied, he did not like that, also I explain him that the IRS paper he brought me is from 2018 an I can not used, I told him what he need to do and how he can get the letter even I told him to call me when he has the letter an I will try to fix him in my schedule, he got upset, an he told me again how ro do my job since he want the blue card for the pharmacy, I try to explain him that is what [facility] financial required and I can not issue the card at this time since the information is incomplete. We demand  the name of the supervisor and he was inform and he left

## 2019-07-02 MED FILL — AMLODIPINE BESYLATE 10 MG T: 10 | 30 days supply | Qty: 30 | Fill #0

## 2019-07-02 MED FILL — ATORVASTATIN 10 MG TABLET: 10 | 30 days supply | Qty: 30 | Fill #0

## 2019-07-13 ENCOUNTER — Telehealth: Payer: Self-pay | Admitting: Internal Medicine

## 2019-07-13 MED ORDER — POLYMYXIN B-TRIMETHOPRIM 10000-0.1 UNIT/ML-% OP SOLN: OPHTHALMIC | 0 refills | Status: DC

## 2019-07-13 NOTE — Telephone Encounter (Signed)
Will forward to pcp

## 2019-07-13 NOTE — Telephone Encounter (Signed)
Patient called saying he was prescribed medication for a stye that he had on his left eye. Patient states the stye is back and would like eye drops prescribed. Patient states that his eye is swollen. Please f/u. Patient uses [facility].

## 2019-07-14 NOTE — Telephone Encounter (Signed)
Contacted pt and went over Dr. Johnson message pt doesn't have any questions or concerns  

## 2019-07-27 ENCOUNTER — Telehealth: Payer: Self-pay | Admitting: Internal Medicine

## 2019-07-27 NOTE — Telephone Encounter (Signed)
Will forward to pcp

## 2019-07-27 NOTE — Telephone Encounter (Signed)
Patient came in requesting an updated letter from his PCP putting him out of work because of his back pain. Patient states that his PCP has wrote a letter for him. Please f/u

## 2019-07-28 NOTE — Telephone Encounter (Signed)
Pt is schedule

## 2019-08-02 MED FILL — AMLODIPINE BESYLATE 10 MG T: 10 | 30 days supply | Qty: 30 | Fill #1

## 2019-08-02 MED FILL — ATORVASTATIN 10 MG TABLET: 10 | 30 days supply | Qty: 30 | Fill #1

## 2019-08-18 ENCOUNTER — Other Ambulatory Visit: Payer: Self-pay

## 2019-08-18 ENCOUNTER — Encounter: Payer: Self-pay | Admitting: Internal Medicine

## 2019-08-18 ENCOUNTER — Ambulatory Visit: Payer: Self-pay | Attending: Internal Medicine | Admitting: Internal Medicine

## 2019-08-18 VITALS — BP 122/83 | HR 70 | Resp 16 | Wt 241.6 lb

## 2019-08-18 DIAGNOSIS — R7303 Prediabetes: Secondary | ICD-10-CM

## 2019-08-18 DIAGNOSIS — E785 Hyperlipidemia, unspecified: Secondary | ICD-10-CM

## 2019-08-18 DIAGNOSIS — Z87891 Personal history of nicotine dependence: Secondary | ICD-10-CM | POA: Insufficient documentation

## 2019-08-18 DIAGNOSIS — M545 Low back pain, unspecified: Secondary | ICD-10-CM

## 2019-08-18 DIAGNOSIS — G8929 Other chronic pain: Secondary | ICD-10-CM

## 2019-08-18 DIAGNOSIS — I10 Essential (primary) hypertension: Secondary | ICD-10-CM

## 2019-08-18 DIAGNOSIS — R200 Anesthesia of skin: Secondary | ICD-10-CM

## 2019-08-18 NOTE — Patient Instructions (Signed)
Please let me know once you have been approved for the orange card/cone discount card.

## 2019-08-18 NOTE — Progress Notes (Signed)
Patient ID: Peter Friedman, male    DOB: 1966-08-05  MRN: [id number]  CC: Letter for School/Work   Subjective: Peter Friedman is a 53 y.o. male who presents for chronic ds management His concerns today include:  Patient with history of HTN, tobacco dependence,pre-DM, HL,ED, chronic LBP withRTside sciatica, right knee painsince7/2016.  Chronic LBP: Patient presents today requesting a letter stating that he is not able to work at this time due to chronic back pain issue and a new issue that he is having with his right foot.  He owes child support but feels he is not able to work at this time.  He was a Education administrator of houses but feels that he cannot get up and down a ladder at this time Pain across lower back getting worse. It is intermittent.  Worse in the mornings when he first gets up.  Eases some during the day. Occasional burst of pain RT leg.   Also bothers him when siting in certain chairs and when he goes to wipe himself after having a BM.  Noticing intermittent numbness in RT leg calf into foot x 2-3 wks.  Shoe on Rt foot feels tight.  Can last 10-15 mins.  Better if he stands and starts walking -uses Robaxin in the evenings and Mobic PRN.  Also uses Voltaren gel few times a day.  Uses gel on back and knee.  HTN:  Compliant with med Norvasc and salt restriction No CP/SOB  PreDM/Obesity:  Use to go to [facility] but not since pandemic.  Walks 4-5 x a wk.  He does not over eat.  Drinks mainly water and coffee. Drinks sweet tea occasionally.  Eats chicken, beef and occasional pork  tob dep:  Quit completely 4-5 mths.    Does not have [facility] discount.  Applied but did not have all supporting document.  He states that he had spoken with our financial counselor about it and felt that he was rude to him and he plans to speak with the office manager about it Patient Active Problem List   Diagnosis Date Noted  . Hyperlipidemia 09/26/2017  . Facet arthritis of lumbar region 02/03/2017  .  Erectile dysfunction 04/30/2016  . Plantar fasciitis, left 04/05/2016  . Seasonal allergies 02/01/2016  . HTN (hypertension) 11/28/2015  . Prediabetes 10/27/2015  . Degenerative tear of medial meniscus of right knee 08/24/2014  . Tobacco abuse 08/24/2014  . Chronic pain of right knee 07/13/2014     Current Outpatient Medications on File Prior to Visit  Medication Sig Dispense Refill  . albuterol (PROVENTIL HFA;VENTOLIN HFA) 108 (90 Base) MCG/ACT inhaler Inhale 2 puffs into the lungs every 6 (six) hours as needed for wheezing or shortness of breath. 54 g 3  . amLODipine (NORVASC) 10 MG tablet Take 1 tablet (10 mg total) by mouth daily. 30 tablet 5  . atorvastatin (LIPITOR) 10 MG tablet Take 1 tablet (10 mg total) by mouth daily. 90 tablet 1  . betamethasone dipropionate (DIPROLENE) 0.05 % ointment Apply topically 2 (two) times daily. (Patient not taking: Reported on 08/18/2019) 30 g 0  . cephALEXin (KEFLEX) 500 MG capsule Take 1 capsule (500 mg total) by mouth 3 (three) times daily. (Patient not taking: Reported on 08/18/2019) 30 capsule 0  . diclofenac sodium (VOLTAREN) 1 % GEL Apply 2 g topically 4 (four) times daily. 100 g 1  . meloxicam (MOBIC) 15 MG tablet Take 1 tablet (15 mg total) by mouth daily. Prn pain 30 tablet 2  .  methocarbamol (ROBAXIN) 500 MG tablet Take 2 tablets (1,000 mg total) by mouth every 8 (eight) hours as needed for muscle spasms. 60 tablet 1  . sildenafil (VIAGRA) 100 MG tablet Take 0.5-1 tablets (50-100 mg total) by mouth daily as needed for erectile dysfunction. 30 tablet 1  . trimethoprim-polymyxin b (POLYTRIM) ophthalmic solution 1 drop applied to the affected eye every 6 hours (Patient not taking: Reported on 08/18/2019) 10 mL 0   No current facility-administered medications on file prior to visit.    Allergies  Allergen Reactions  . Chantix [Varenicline] Other (See Comments)    Bad thoughts    Social History   Socioeconomic History  . Marital status:  Single    Spouse name: Not on file  . Number of children: Not on file  . Years of education: Not on file  . Highest education level: Not on file  Occupational History  . Not on file  Tobacco Use  . Smoking status: Former Smoker    Packs/day: 0.50    Types: Cigarettes  . Smokeless tobacco: Never Used  Substance and Sexual Activity  . Alcohol use: No  . Drug use: No  . Sexual activity: Not on file  Other Topics Concern  . Not on file  Social History Narrative  . Not on file   Social Determinants of Health   Financial Resource Strain:   . Difficulty of Paying Living Expenses: Not on file  Food Insecurity:   . Worried About Programme researcher, broadcasting/film/videounning Out of Food in the Last Year: Not on file  . Ran Out of Food in the Last Year: Not on file  Transportation Needs:   . Lack of Transportation (Medical): Not on file  . Lack of Transportation (Non-Medical): Not on file  Physical Activity:   . Days of Exercise per Week: Not on file  . Minutes of Exercise per Session: Not on file  Stress:   . Feeling of Stress : Not on file  Social Connections:   . Frequency of Communication with Friends and Family: Not on file  . Frequency of Social Gatherings with Friends and Family: Not on file  . Attends Religious Services: Not on file  . Active Member of Clubs or Organizations: Not on file  . Attends BankerClub or Organization Meetings: Not on file  . Marital Status: Not on file  Intimate Partner Violence:   . Fear of Current or Ex-Partner: Not on file  . Emotionally Abused: Not on file  . Physically Abused: Not on file  . Sexually Abused: Not on file    Family History  Problem Relation Age of Onset  . Hypertension Mother   . Hypertension Father   . Lung cancer Brother   . Hypertension Sister     Past Surgical History:  Procedure Laterality Date  . KNEE ARTHROSCOPY Right 02/2015    ROS: Review of Systems Negative except as stated above  PHYSICAL EXAM: BP 122/83   Pulse 70   Resp 16   Wt 241 lb  9.6 oz (109.6 kg)   SpO2 99%   BMI 35.68 kg/m   Wt Readings from Last 3 Encounters:  08/18/19 241 lb 9.6 oz (109.6 kg)  06/16/19 242 lb 9.6 oz (110 kg)  04/30/18 241 lb 1.6 oz (109.4 kg)    Physical Exam General appearance - alert, well appearing, and in no distress Mental status - normal mood, behavior, speech, dress, motor activity, and thought processes Chest - clear to auscultation, no wheezes, rales or rhonchi,  symmetric air entry Heart - normal rate, regular rhythm, normal S1, S2, no murmurs, rubs, clicks or gallops Neurological -gait is stable.  Power in the lower extremities: Proximally 5/5 bilaterally.  Distally 5/5 on the left.  4/5 on the right but patient was inconsistent on repeated attempts.  Subjective decrease sensation on the lateral and posterior right calf.  Decreased sensation on the plantar surface of the right foot Musculoskeletal -mild tenderness on palpation of the upper lumbar spine.  Straight leg raise negative.  CMP Latest Ref Rng & Units 06/16/2019 06/26/2017  Glucose 65 - 99 mg/dL 93 103(H)  BUN 6 - 24 mg/dL 10 11  Creatinine 0.76 - 1.27 mg/dL 0.76 0.80  Sodium 134 - 144 mmol/L 136 139  Potassium 3.5 - 5.2 mmol/L 4.6 4.5  Chloride 96 - 106 mmol/L 102 103  CO2 20 - 29 mmol/L 23 21  Calcium 8.7 - 10.2 mg/dL 9.4 9.4  Total Protein 6.0 - 8.5 g/dL 7.1 6.8  Total Bilirubin 0.0 - 1.2 mg/dL 0.2 <0.2  Alkaline Phos 39 - 117 IU/L 65 56  AST 0 - 40 IU/L 15 14  ALT 0 - 44 IU/L 17 12   Lipid Panel     Component Value Date/Time   CHOL 163 06/16/2019 0911   TRIG 92 06/16/2019 0911   HDL 41 06/16/2019 0911   CHOLHDL 4.0 06/16/2019 0911   LDLCALC 105 (H) 06/16/2019 0911    CBC    Component Value Date/Time   WBC 7.1 06/16/2019 0911   RBC 5.03 06/16/2019 0911   HGB 14.0 06/16/2019 0911   HCT 41.3 06/16/2019 0911   PLT 340 06/16/2019 0911   MCV 82 06/16/2019 0911   MCH 27.8 06/16/2019 0911   MCHC 33.9 06/16/2019 0911   RDW 12.3 06/16/2019 0911    LYMPHSABS 3.0 06/16/2019 0911   EOSABS 0.1 06/16/2019 0911   BASOSABS 0.0 06/16/2019 0911    ASSESSMENT AND PLAN: 1. Chronic bilateral low back pain without sciatica -Recommend that we get an updated x-ray of the lumbar spine.  He has known osteoarthritis of the lumbar spine based on x-ray done over 2 years ago.  He will continue meloxicam and Robaxin as needed.  In regards to letter stating he is disabled, I informed patient that I am not able to give such a letter as based on history and exam he is not completely disabled and is still able to do some work.  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  He is agreeable to that. -He will try to get the orange card and cone discount card upon which we can consider referring him for some physical therapy.  2. Numbness in right leg -This may need to be evaluated further either with EMG or advanced imaging of the lower back.  He does have prediabetes which can also cause peripheral neuropathy symptoms early but usually bilaterally - Vitamin B12  3. Essential hypertension Close to goal.  Continue current medication  4. Hyperlipidemia, unspecified hyperlipidemia type Continue atorvastatin  5. Prediabetes Discussed and encourage healthy eating habits.  He will continue his walks.  Discussed starting Metformin but patient declined  6. Former smoker Commended him on quitting.  Encouraged him to remain tobacco free.  Less than 5 minutes spent  on counseling.   Patient was given the opportunity to ask questions.  Patient verbalized understanding of the plan and was able to repeat key elements of the plan.   No orders of the defined types were placed in this encounter.    Requested Prescriptions    No prescriptions requested or ordered in this encounter     No follow-ups on file.  Jonah Blue, MD, FACP

## 2019-08-19 ENCOUNTER — Telehealth: Payer: Self-pay

## 2019-08-19 LAB — VITAMIN B12: Vitamin B-12: 327 pg/mL (ref 232–1245)

## 2019-08-19 NOTE — Telephone Encounter (Signed)
Contacted pt to go over lab results pt is aware and doesn't have any questions or concerns 

## 2019-08-19 NOTE — Progress Notes (Signed)
Let patient know that his vitamin B12 level is in the low normal range.  I recommend taking vitamin B12 supplement 1000 mcg daily.  This can be purchased over-the-counter.

## 2019-09-01 MED FILL — AMLODIPINE BESYLATE 10 MG T: 10 | 30 days supply | Qty: 30 | Fill #2

## 2019-09-01 MED FILL — ATORVASTATIN 10 MG TABLET: 10 | 30 days supply | Qty: 30 | Fill #2

## 2019-09-03 ENCOUNTER — Other Ambulatory Visit: Payer: Self-pay | Admitting: Physician Assistant

## 2019-09-03 DIAGNOSIS — N529 Male erectile dysfunction, unspecified: Secondary | ICD-10-CM

## 2019-09-03 MED ORDER — SILDENAFIL CITRATE 25 MG PO TABS: 100.0000 mg | ORAL_TABLET | Freq: Every day | ORAL | 2 refills | Status: DC | PRN

## 2019-09-06 MED FILL — !VIAGRA 25 MG TABLET: 25 | 30 days supply | Qty: 40 | Fill #0

## 2019-10-04 MED FILL — ATORVASTATIN 10 MG TABLET: 10 | 30 days supply | Qty: 30 | Fill #3

## 2019-10-04 MED FILL — AMLODIPINE BESYLATE 10 MG T: 10 | 30 days supply | Qty: 30 | Fill #3

## 2019-10-05 MED FILL — !VIAGRA 25 MG TABLET: 25 | 30 days supply | Qty: 40 | Fill #1

## 2019-10-28 ENCOUNTER — Ambulatory Visit: Payer: Self-pay | Attending: Internal Medicine

## 2019-10-28 DIAGNOSIS — Z23 Encounter for immunization: Secondary | ICD-10-CM

## 2019-10-28 NOTE — Progress Notes (Signed)
   Covid-19 Vaccination Clinic  Name:  Peter Friedman    MRN: [id number] DOB: 1966/05/15  10/28/2019  Peter Friedman was observed post Covid-19 immunization for 15 minutes without incident. He was provided with Vaccine Information Sheet and instruction to access the V-Safe system.   Peter Friedman was instructed to call 911 with any severe reactions post vaccine: Marland Kitchen Difficulty breathing  . Swelling of face and throat  . A fast heartbeat  . A bad rash all over body  . Dizziness and weakness   Immunizations Administered    Name Date Dose VIS Date Route   Pfizer COVID-19 Vaccine 10/28/2019  8:04 AM 0.3 mL 07/30/2019 Intramuscular   Manufacturer: [employer], [employer]   Lot: [id number]   NDC: [address]-0

## 2019-11-02 MED FILL — AMLODIPINE BESYLATE 10 MG T: 10 | 30 days supply | Qty: 30 | Fill #4

## 2019-11-02 MED FILL — ATORVASTATIN 10 MG TABLET: 10 | 30 days supply | Qty: 30 | Fill #4

## 2019-11-22 ENCOUNTER — Ambulatory Visit: Payer: Self-pay | Attending: Internal Medicine

## 2019-11-22 DIAGNOSIS — Z23 Encounter for immunization: Secondary | ICD-10-CM

## 2019-11-22 NOTE — Progress Notes (Signed)
   Covid-19 Vaccination Clinic  Name:  Peter Friedman    MRN: [id number] DOB: July 21, 1966  11/22/2019  [name] was observed post Covid-19 immunization for 15 minutes without incident. He was provided with Vaccine Information Sheet and instruction to access the V-Safe system.   [name] was instructed to call 911 with any severe reactions post vaccine: Marland Kitchen Difficulty breathing  . Swelling of face and throat  . A fast heartbeat  . A bad rash all over body  . Dizziness and weakness   Immunizations Administered    Name Date Dose VIS Date Route   Pfizer COVID-19 Vaccine 11/22/2019  8:18 AM 0.3 mL 07/30/2019 Intramuscular   Manufacturer: [employer], [employer]   Lot: [id number]   NDC: [address]-9

## 2019-12-03 MED FILL — ATORVASTATIN 10 MG TABLET: 10 | 30 days supply | Qty: 30 | Fill #5

## 2019-12-03 MED FILL — AMLODIPINE BESYLATE 10 MG T: 10 | 30 days supply | Qty: 30 | Fill #5

## 2019-12-16 ENCOUNTER — Other Ambulatory Visit: Payer: Self-pay | Admitting: Internal Medicine

## 2019-12-16 ENCOUNTER — Encounter: Payer: Self-pay | Admitting: Internal Medicine

## 2019-12-16 ENCOUNTER — Other Ambulatory Visit: Payer: Self-pay

## 2019-12-16 ENCOUNTER — Ambulatory Visit: Payer: Self-pay | Attending: Internal Medicine | Admitting: Internal Medicine

## 2019-12-16 VITALS — BP 132/72 | HR 63 | Temp 96.8°F | Resp 16 | Wt 238.6 lb

## 2019-12-16 DIAGNOSIS — Z1211 Encounter for screening for malignant neoplasm of colon: Secondary | ICD-10-CM

## 2019-12-16 DIAGNOSIS — E669 Obesity, unspecified: Secondary | ICD-10-CM

## 2019-12-16 DIAGNOSIS — M7711 Lateral epicondylitis, right elbow: Secondary | ICD-10-CM

## 2019-12-16 DIAGNOSIS — E785 Hyperlipidemia, unspecified: Secondary | ICD-10-CM

## 2019-12-16 DIAGNOSIS — Z87891 Personal history of nicotine dependence: Secondary | ICD-10-CM

## 2019-12-16 DIAGNOSIS — I10 Essential (primary) hypertension: Secondary | ICD-10-CM

## 2019-12-16 DIAGNOSIS — R7303 Prediabetes: Secondary | ICD-10-CM

## 2019-12-16 MED ORDER — ATORVASTATIN CALCIUM 10 MG PO TABS: 10.0000 mg | ORAL_TABLET | Freq: Every day | ORAL | 1 refills | Status: DC

## 2019-12-16 MED ORDER — AMLODIPINE BESYLATE 10 MG PO TABS: 10.0000 mg | ORAL_TABLET | Freq: Every day | ORAL | 5 refills | Status: DC

## 2019-12-16 MED ORDER — NAPROXEN 500 MG PO TABS: 500.0000 mg | ORAL_TABLET | Freq: Two times a day (BID) | ORAL | 0 refills | Status: DC

## 2019-12-16 MED FILL — NAPROXEN 500 MG TABLET: 500 | 15 days supply | Qty: 30 | Fill #0

## 2019-12-16 NOTE — Patient Instructions (Signed)
Hold off on taking the meloxicam while you are on the Naprosyn. Try to avoid using your right arm excessively over the next 2 weeks. Use warm compresses to the elbow.  Tennis Elbow Tennis elbow is swelling (inflammation) in your outer forearm, near your elbow. Swelling affects the tissues that connect muscle to bone (tendons). Tennis elbow can happen in any sport or job in which you use your elbow too much. It is caused by doing the same motion over and over. Tennis elbow can cause:  Pain and tenderness in your forearm and the outer part of your elbow. You may have pain all the time, or only when using the arm.  A burning feeling. This runs from your elbow through your arm.  Weak grip in your hand. Follow these instructions at home: Activity  Rest your elbow and wrist. Avoid activities that cause problems, as told by your doctor.  If told by your doctor, wear an elbow strap to reduce stress on the area.  Do physical therapy exercises as told.  If you lift an object, lift it with your palm facing up. This is easier on your elbow. Lifestyle  If your tennis elbow is caused by sports, check your equipment and make sure that: ? You are using it correctly. ? It fits you well.  If your tennis elbow is caused by work or by using a computer, take breaks often to stretch your arm. Talk with your manager about how you can manage your condition at work. If you have a brace:  Wear the brace as told by your doctor. Remove it only as told by your doctor.  Loosen the brace if your fingers tingle, get numb, or turn cold and blue.  Keep the brace clean.  If the brace is not waterproof, ask your doctor if you may take the brace off for bathing. If you must keep the brace on while bathing: ? Do not let it get wet. ? Cover it with a watertight covering when you take a bath or a shower. General instructions   If told, put ice on the painful area: ? Put ice in a plastic bag. ? Place a towel  between your skin and the bag. ? Leave the ice on for 20 minutes, 2-3 times a day.  Take over-the-counter and prescription medicines only as told by your doctor.  Keep all follow-up visits as told by your doctor. This is important. Contact a doctor if:  Your pain does not get better with treatment.  Your pain gets worse.  You have weakness in your forearm, hand, or fingers.  You cannot feel your forearm, hand, or fingers. Summary  Tennis elbow is swelling (inflammation) in your outer forearm, near your elbow.  Tennis elbow is caused by doing the same motion over and over.  Rest your elbow and wrist. Avoid activities that cause problems, as told by your doctor.  If told, put ice on the painful area for 20 minutes, 2-3 times a day. This information is not intended to replace advice given to you by your health care provider. Make sure you discuss any questions you have with your health care provider. Document Revised: 05/01/2018 Document Reviewed: 05/20/2017 Elsevier Patient Education  2020 [employer].

## 2019-12-16 NOTE — Progress Notes (Signed)
Patient ID: Peter Friedman, male    DOB: 02-02-1966  MRN: [id number]  CC: Hypertension and Elbow Pain (right)   Subjective: Peter Friedman is a 54 y.o. male who presents for chronic ds management His concerns today include:  Patient with history of HTN, former tob dep,pre-DM,HL,ED, chronic LBP withRTside sciatica, right knee painsince7/2016.  C/o pain RT elbow since Easter wkend. Feels it is swollen and has decreased motion.  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 Using Voltaren gel and icing sleeve  HYPERTENSION Currently taking: see medication list Med Adherence: [x]  Yes    []  No Medication side effects: []  Yes    [x]  No Adherence with salt restriction: []  Yes    [x]  No Home Monitoring?: [x]  Yes    [x]  No Monitoring Frequency: []  Yes    []  No Home BP results range: []  Yes    []  No SOB? []  Yes    [x]  No Chest Pain?: []  Yes    [x]  No Leg swelling?: []  Yes    [x]  No Headaches?: []  Yes    [x]  No Dizziness? []  Yes    [x]  No Comments:   Tob dep: remains tob free  Vit B12: He did get the results of his blood test informing him that his vitamin B12 level is in the low normal range and that I recommended taking vitamin B12 1000 mcg daily.  Patient states that he did purchase over-the-counter and is taking.  Obesity/prediabetes: Reports that he is doing okay with his eating habits.  He eats about 2 solid meals a day.  He tries to limit consumption of sweets.  He is staying active since started working again as a .  Weight is down 4 pounds today from October of last year.  HM:  Pfzier Covid vaccine series completed.  He is due for colon cancer screening. Patient Active Problem List   Diagnosis Date Noted  . Former smoker 08/18/2019  . Hyperlipidemia 09/26/2017  . Facet arthritis of lumbar region 02/03/2017  . Erectile dysfunction 04/30/2016  . Plantar fasciitis, left 04/05/2016  . Seasonal allergies 02/01/2016  . HTN  (hypertension) 11/28/2015  . Prediabetes 10/27/2015  . Degenerative tear of medial meniscus of right knee 08/24/2014  . Tobacco abuse 08/24/2014  . Chronic pain of right knee 07/13/2014     Current Outpatient Medications on File Prior to Visit  Medication Sig Dispense Refill  . albuterol (PROVENTIL HFA;VENTOLIN HFA) 108 (90 Base) MCG/ACT inhaler Inhale 2 puffs into the lungs every 6 (six) hours as needed for wheezing or shortness of breath. 54 g 3  . diclofenac sodium (VOLTAREN) 1 % GEL Apply 2 g topically 4 (four) times daily. 100 g 1  . meloxicam (MOBIC) 15 MG tablet Take 1 tablet (15 mg total) by mouth daily. Prn pain 30 tablet 2  . methocarbamol (ROBAXIN) 500 MG tablet Take 2 tablets (1,000 mg total) by mouth every 8 (eight) hours as needed for muscle spasms. 60 tablet 1  . sildenafil (VIAGRA) 25 MG tablet Take 4 tablets (100 mg total) by mouth daily as needed for erectile dysfunction. 40 tablet 2   No current facility-administered medications on file prior to visit.    Allergies  Allergen Reactions  . Chantix [Varenicline] Other (See Comments)    Bad thoughts    Social History   Socioeconomic History  . Marital status: Single    Spouse name: Not on file  .  Number of children: Not on file  . Years of education: Not on file  . Highest education level: Not on file  Occupational History  . Not on file  Tobacco Use  . Smoking status: Former Smoker    Packs/day: 0.50    Types: Cigarettes  . Smokeless tobacco: Never Used  Substance and Sexual Activity  . Alcohol use: No  . Drug use: No  . Sexual activity: Not on file  Other Topics Concern  . Not on file  Social History Narrative  . Not on file   Social Determinants of Health   Financial Resource Strain:   . Difficulty of Paying Living Expenses:   Food Insecurity:   . Worried About Charity fundraiser in the Last Year:   . Arboriculturist in the Last Year:   Transportation Needs:   . Film/video editor  (Medical):   Marland Kitchen Lack of Transportation (Non-Medical):   Physical Activity:   . Days of Exercise per Week:   . Minutes of Exercise per Session:   Stress:   . Feeling of Stress :   Social Connections:   . Frequency of Communication with Friends and Family:   . Frequency of Social Gatherings with Friends and Family:   . Attends Religious Services:   . Active Member of Clubs or Organizations:   . Attends Archivist Meetings:   Marland Kitchen Marital Status:   Intimate Partner Violence:   . Fear of Current or Ex-Partner:   . Emotionally Abused:   Marland Kitchen Physically Abused:   . Sexually Abused:     Family History  Problem Relation Age of Onset  . Hypertension Mother   . Hypertension Father   . Lung cancer Brother   . Hypertension Sister     Past Surgical History:  Procedure Laterality Date  . KNEE ARTHROSCOPY Right 02/2015    ROS: Review of Systems Negative except as stated above  PHYSICAL EXAM: BP 132/72   Pulse 63   Temp (!) 96.8 F (36 C)   Resp 16   Wt 238 lb 9.6 oz (108.2 kg)   SpO2 98%   BMI 35.24 kg/m   Wt Readings from Last 3 Encounters:  12/16/19 238 lb 9.6 oz (108.2 kg)  08/18/19 241 lb 9.6 oz (109.6 kg)  06/16/19 242 lb 9.6 oz (110 kg)    Physical Exam  General appearance - alert, well appearing, and in no distress Mental status - normal mood, behavior, speech, dress, motor activity, and thought processes Neck - supple, no significant adenopathy Chest - clear to auscultation, no wheezes, rales or rhonchi, symmetric air entry Heart - normal rate, regular rhythm, normal S1, S2, no murmurs, rubs, clicks or gallops Musculoskeletal -right elbow: Slight edema below the olecranon process.  Mild tenderness around the epicondyles.  Mild discomfort with passive range of motion. Extremities - peripheral pulses normal, no pedal edema, no clubbing or cyanosis  CMP Latest Ref Rng & Units 06/16/2019 06/26/2017  Glucose 65 - 99 mg/dL 93 103(H)  BUN 6 - 24 mg/dL 10 11    Creatinine 0.76 - 1.27 mg/dL 0.76 0.80  Sodium 134 - 144 mmol/L 136 139  Potassium 3.5 - 5.2 mmol/L 4.6 4.5  Chloride 96 - 106 mmol/L 102 103  CO2 20 - 29 mmol/L 23 21  Calcium 8.7 - 10.2 mg/dL 9.4 9.4  Total Protein 6.0 - 8.5 g/dL 7.1 6.8  Total Bilirubin 0.0 - 1.2 mg/dL 0.2 <0.2  Alkaline Phos 39 -  117 IU/L 65 56  AST 0 - 40 IU/L 15 14  ALT 0 - 44 IU/L 17 12   Lipid Panel     Component Value Date/Time   CHOL 163 06/16/2019 0911   TRIG 92 06/16/2019 0911   HDL 41 06/16/2019 0911   CHOLHDL 4.0 06/16/2019 0911   LDLCALC 105 (H) 06/16/2019 0911    CBC    Component Value Date/Time   WBC 7.1 06/16/2019 0911   RBC 5.03 06/16/2019 0911   HGB 14.0 06/16/2019 0911   HCT 41.3 06/16/2019 0911   PLT 340 06/16/2019 0911   MCV 82 06/16/2019 0911   MCH 27.8 06/16/2019 0911   MCHC 33.9 06/16/2019 0911   RDW 12.3 06/16/2019 0911   LYMPHSABS 3.0 06/16/2019 0911   EOSABS 0.1 06/16/2019 0911   BASOSABS 0.0 06/16/2019 0911    ASSESSMENT AND PLAN: 1. 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 Recommend rest of this arm/elbow for the next 2 weeks.  Warm compresses 2-3 times a day.  Naprosyn to use twice a day with meals.  Advised to hold off on taking meloxicam while on Naprosyn. - naproxen (NAPROSYN) 500 MG tablet; Take 1 tablet (500 mg total) by mouth 2 (two) times daily with a meal.  Dispense: 30 tablet; Refill: 0  2. Essential hypertension Close to goal.  Continue amlodipine and low-salt diet - amLODipine (NORVASC) 10 MG tablet; Take 1 tablet (10 mg total) by mouth daily.  Dispense: 30 tablet; Refill: 5  3. Hyperlipidemia, unspecified hyperlipidemia type - atorvastatin (LIPITOR) 10 MG tablet; Take 1 tablet (10 mg total) by mouth daily.  Dispense: 90 tablet; Refill: 1  4. Prediabetes 5. Obesity (BMI 30-39.9) Commended him on better eating habits.  More dietary counseling given.  Encouraged him to stay active.  6.  Former tobacco use Commended him and encouraged him to remain tobacco free  7.  Colon CA screening Fit test ordered.   Patient was given the opportunity to ask questions.  Patient verbalized understanding of the plan and was able to repeat key elements of the plan.   Orders Placed This Encounter  Procedures  . Fecal occult blood, imunochemical([facility])     Requested Prescriptions   Signed Prescriptions Disp Refills  . amLODipine (NORVASC) 10 MG tablet 30 tablet 5    Sig: Take 1 tablet (10 mg total) by mouth daily.  Marland Kitchen atorvastatin (LIPITOR) 10 MG tablet 90 tablet 1    Sig: Take 1 tablet (10 mg total) by mouth daily.  . naproxen (NAPROSYN) 500 MG tablet 30 tablet 0    Sig: Take 1 tablet (500 mg total) by mouth 2 (two) times daily with a meal.    Return in about 4 months (around 04/16/2020).  Jonah Blue, MD, FACP

## 2020-01-04 MED FILL — ATORVASTATIN 10 MG TABLET: 10 | 30 days supply | Qty: 30 | Fill #0

## 2020-01-04 MED FILL — AMLODIPINE BESYLATE 10 MG T: 10 | 30 days supply | Qty: 30 | Fill #0

## 2020-02-02 MED FILL — ATORVASTATIN 10 MG TABLET: 10 | 30 days supply | Qty: 30 | Fill #1

## 2020-02-02 MED FILL — AMLODIPINE BESYLATE 10 MG T: 10 | 30 days supply | Qty: 30 | Fill #1

## 2020-03-02 MED FILL — AMLODIPINE BESYLATE 10 MG T: 10 | 30 days supply | Qty: 30 | Fill #2

## 2020-03-02 MED FILL — ATORVASTATIN 10 MG TABLET: 10 | 30 days supply | Qty: 30 | Fill #2

## 2020-04-04 ENCOUNTER — Other Ambulatory Visit: Payer: Self-pay | Admitting: Internal Medicine

## 2020-04-04 DIAGNOSIS — E785 Hyperlipidemia, unspecified: Secondary | ICD-10-CM

## 2020-04-04 DIAGNOSIS — I10 Essential (primary) hypertension: Secondary | ICD-10-CM

## 2020-04-04 MED ORDER — ATORVASTATIN CALCIUM 10 MG PO TABS: 10.0000 mg | ORAL_TABLET | Freq: Every day | ORAL | 0 refills | Status: DC

## 2020-04-04 MED ORDER — AMLODIPINE BESYLATE 10 MG PO TABS: 10.0000 mg | ORAL_TABLET | Freq: Every day | ORAL | 1 refills | Status: DC

## 2020-04-04 MED FILL — ATORVASTATIN 10 MG TABLET: 10 | 30 days supply | Qty: 30 | Fill #3

## 2020-04-04 MED FILL — AMLODIPINE BESYLATE 10 MG T: 10 | 30 days supply | Qty: 30 | Fill #3

## 2020-04-04 NOTE — Telephone Encounter (Signed)
Medication Refill - Medication: amLODipine (NORVASC) 10 MG tablet atorvastatin (LIPITOR) 10 MG tablet     Preferred Pharmacy (with phone number or street name):  [facility] - [place], [place] - [address] Phone:  [phone]  Fax:  [phone]       Agent: Please be advised that RX refills may take up to 3 business days. We ask that you follow-up with your pharmacy.

## 2020-04-17 ENCOUNTER — Ambulatory Visit: Payer: Self-pay | Attending: Internal Medicine | Admitting: Internal Medicine

## 2020-04-17 DIAGNOSIS — E669 Obesity, unspecified: Secondary | ICD-10-CM

## 2020-04-17 DIAGNOSIS — I10 Essential (primary) hypertension: Secondary | ICD-10-CM

## 2020-04-17 DIAGNOSIS — E785 Hyperlipidemia, unspecified: Secondary | ICD-10-CM

## 2020-04-17 DIAGNOSIS — M7711 Lateral epicondylitis, right elbow: Secondary | ICD-10-CM

## 2020-04-17 DIAGNOSIS — Z1211 Encounter for screening for malignant neoplasm of colon: Secondary | ICD-10-CM

## 2020-04-17 DIAGNOSIS — Z23 Encounter for immunization: Secondary | ICD-10-CM

## 2020-04-17 NOTE — Progress Notes (Addendum)
Virtual Visit via Telephone Note  I connected with [place] on 04/17/20 at 1:44 p.m by telephone and verified that I am speaking with the correct person using two identifiers.   I discussed the limitations, risks, security and privacy concerns of performing an evaluation and management service by telephone and the availability of in person appointments. I also discussed with the patient that there may be a patient responsible charge related to this service. The patient expressed understanding and agreed to proceed.  Addendum:  I was in the office at the time of this visit. Pt was at home at the time of this visit.  History of Present Illness: Patient with history of HTN, former tob dep,pre-DM,HL,ED, chronic LBP withRTside sciatica, right knee painsince7/2016.  Last evaluated 11/2019.  Purpose of today's visit is chronic disease management.  Still having some issues with the RT elbow.  "Sometimes I could be holding something and just drop it."  Used various OTC pain cream  HTN: checked BP about 1 mth ago at a drug store.  Told the reading was good No CP/SOB/LE edema/dizziness  Obesity:  Still goes to [facility] 2 x a wk. Wgh about same since last visit Mindful of portion sizes.    HL:  Compliant with Lipitor.  Tolerating okay.    HM: due for flu.  Reportedly turned in FIT test at front desk 2 mths.  I never received the results.  Outpatient Encounter Medications as of 04/17/2020  Medication Sig  . albuterol (PROVENTIL HFA;VENTOLIN HFA) 108 (90 Base) MCG/ACT inhaler Inhale 2 puffs into the lungs every 6 (six) hours as needed for wheezing or shortness of breath.  Marland Kitchen amLODipine (NORVASC) 10 MG tablet Take 1 tablet (10 mg total) by mouth daily.  Marland Kitchen atorvastatin (LIPITOR) 10 MG tablet Take 1 tablet (10 mg total) by mouth daily.  . diclofenac sodium (VOLTAREN) 1 % GEL Apply 2 g topically 4 (four) times daily.  . meloxicam (MOBIC) 15 MG tablet Take 1 tablet (15 mg total) by mouth daily.  Prn pain  . methocarbamol (ROBAXIN) 500 MG tablet Take 2 tablets (1,000 mg total) by mouth every 8 (eight) hours as needed for muscle spasms.  . naproxen (NAPROSYN) 500 MG tablet Take 1 tablet (500 mg total) by mouth 2 (two) times daily with a meal.  . sildenafil (VIAGRA) 25 MG tablet Take 4 tablets (100 mg total) by mouth daily as needed for erectile dysfunction.   No facility-administered encounter medications on file as of 04/17/2020.      Observations/Objective: No direct observation done as this was a telephone encounter.  Assessment and Plan: 1. Essential hypertension Continue amlodipine and low-salt diet. - CBC; Future - Comprehensive metabolic panel; Future  2. Hyperlipidemia, unspecified hyperlipidemia type Continue atorvastatin - Lipid panel; Future  3. Obesity (BMI 30-39.9) Encouraged him to continue healthy eating habits and regular exercise  4. Lateral epicondylitis of right elbow I recommend referral to orthopedics.  He needs to update the orange card.  Once approved he will let me know so that we can submit the referral  5. Screening for colon cancer He is agreeable to doing another fit test.  When he comes to the lab next week he will try to give the sample while he is here so that the kit can be turned into the lab at the same time. - Fecal occult blood, imunochemical([facility])  6. 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 The patient was provided an opportunity to ask questions and all were answered. The patient agreed with the plan and demonstrated an understanding of the instructions.   The patient was advised to call back or seek an in-person evaluation if the symptoms worsen or if the condition fails to improve as anticipated.  I provided 10 minutes of non-face-to-face time during this  encounter.   Karle Plumber, MD

## 2020-04-17 NOTE — Progress Notes (Signed)
Pt states he is still having issues with his arm

## 2020-04-26 ENCOUNTER — Ambulatory Visit: Payer: Self-pay | Attending: Family Medicine | Admitting: Pharmacist

## 2020-04-26 ENCOUNTER — Other Ambulatory Visit: Payer: Self-pay

## 2020-04-26 DIAGNOSIS — E785 Hyperlipidemia, unspecified: Secondary | ICD-10-CM

## 2020-04-26 DIAGNOSIS — Z23 Encounter for immunization: Secondary | ICD-10-CM

## 2020-04-26 DIAGNOSIS — I10 Essential (primary) hypertension: Secondary | ICD-10-CM

## 2020-04-26 NOTE — Progress Notes (Signed)
Patient presents for vaccination against influenza per orders of Dr. Johnson. Consent given. Counseling provided. No contraindications exists. Vaccine administered without incident.  ° °Luke Van Ausdall, PharmD, CPP °Clinical Pharmacist °[facility] °[phone] ° °

## 2020-04-27 LAB — COMPREHENSIVE METABOLIC PANEL
ALT: 15 IU/L (ref 0–44)
AST: 12 IU/L (ref 0–40)
Albumin/Globulin Ratio: 1.6 (ref 1.2–2.2)
Albumin: 4.3 g/dL (ref 3.8–4.9)
Alkaline Phosphatase: 63 IU/L (ref 48–121)
BUN/Creatinine Ratio: 15 (ref 9–20)
BUN: 12 mg/dL (ref 6–24)
Bilirubin Total: <0.2 mg/dL (ref 0.0–1.2)
CO2: 23 mmol/L (ref 20–29)
Calcium: 9.3 mg/dL (ref 8.7–10.2)
Chloride: 101 mmol/L (ref 96–106)
Creatinine, Ser: 0.79 mg/dL (ref 0.76–1.27)
GFR calc Af Amer: 119 mL/min/{1.73_m2} (ref >59)
GFR calc non Af Amer: 103 mL/min/{1.73_m2} (ref >59)
Globulin, Total: 2.7 g/dL (ref 1.5–4.5)
Glucose: 100 mg/dL — ABNORMAL HIGH (ref 65–99)
Potassium: 4.4 mmol/L (ref 3.5–5.2)
Sodium: 135 mmol/L (ref 134–144)
Total Protein: 7 g/dL (ref 6.0–8.5)

## 2020-04-27 LAB — CBC
Hematocrit: 41 % (ref 37.5–51.0)
Hemoglobin: 13.6 g/dL (ref 13.0–17.7)
MCH: 27.3 pg (ref 26.6–33.0)
MCHC: 33.2 g/dL (ref 31.5–35.7)
MCV: 82 fL (ref 79–97)
Platelets: 352 10*3/uL (ref 150–450)
RBC: 4.98 x10E6/uL (ref 4.14–5.80)
RDW: 13.1 % (ref 11.6–15.4)
WBC: 7.7 10*3/uL (ref 3.4–10.8)

## 2020-04-27 LAB — LIPID PANEL
Chol/HDL Ratio: 3.7 ratio (ref 0.0–5.0)
Cholesterol, Total: 148 mg/dL (ref 100–199)
HDL: 40 mg/dL (ref >39)
LDL Chol Calc (NIH): 95 mg/dL (ref 0–99)
Triglycerides: 66 mg/dL (ref 0–149)
VLDL Cholesterol Cal: 13 mg/dL (ref 5–40)

## 2020-04-27 NOTE — Progress Notes (Signed)
Let patient know that his cholesterol is normal.  Kidney and liver function tests normal.  Blood count revealed normal blood cell counts including red blood cells, white blood cell and platelets.

## 2020-05-06 ENCOUNTER — Telehealth: Payer: Self-pay

## 2020-05-06 NOTE — Telephone Encounter (Signed)
Contacted pt to go over lab results pt is aware and doesn't have any questions or concerns 

## 2020-05-08 MED FILL — AMLODIPINE BESYLATE 10 MG T: 10 | 30 days supply | Qty: 30 | Fill #4

## 2020-05-08 MED FILL — ?ATORVASTATIN 10 MG TABLET: 10 | 30 days supply | Qty: 30 | Fill #4

## 2020-06-05 MED FILL — !VIAGRA 25 MG TABLET: 25 | 30 days supply | Qty: 40 | Fill #2

## 2020-06-05 MED FILL — AMLODIPINE BESYLATE 10 MG T: 10 | 30 days supply | Qty: 30 | Fill #5

## 2020-06-05 MED FILL — ?ATORVASTATIN 10 MG TABLET: 10 | 30 days supply | Qty: 30 | Fill #5

## 2020-07-06 MED FILL — AMLODIPINE BESYLATE 10 MG T: 10 | 30 days supply | Qty: 30 | Fill #0

## 2020-07-06 MED FILL — ATORVASTATIN 10 MG TABLET: 10 | 30 days supply | Qty: 30 | Fill #0

## 2020-08-09 MED FILL — AMLODIPINE BESYLATE 10 MG T: 10 | 30 days supply | Qty: 30 | Fill #1

## 2020-08-09 MED FILL — ?ATORVASTATIN 10 MG TABLET: 10 | 30 days supply | Qty: 30 | Fill #1

## 2020-08-25 ENCOUNTER — Encounter: Payer: Self-pay | Admitting: Internal Medicine

## 2020-08-25 ENCOUNTER — Other Ambulatory Visit: Payer: Self-pay | Admitting: Internal Medicine

## 2020-08-25 ENCOUNTER — Other Ambulatory Visit: Payer: Self-pay

## 2020-08-25 ENCOUNTER — Ambulatory Visit: Payer: Self-pay | Attending: Internal Medicine | Admitting: Internal Medicine

## 2020-08-25 VITALS — BP 124/74 | HR 81 | Temp 98.2°F | Resp 16 | Wt 237.4 lb

## 2020-08-25 DIAGNOSIS — Z1211 Encounter for screening for malignant neoplasm of colon: Secondary | ICD-10-CM

## 2020-08-25 DIAGNOSIS — G4709 Other insomnia: Secondary | ICD-10-CM

## 2020-08-25 DIAGNOSIS — N529 Male erectile dysfunction, unspecified: Secondary | ICD-10-CM

## 2020-08-25 DIAGNOSIS — Z9229 Personal history of other drug therapy: Secondary | ICD-10-CM | POA: Insufficient documentation

## 2020-08-25 DIAGNOSIS — E66812 Obesity, class 2: Secondary | ICD-10-CM | POA: Insufficient documentation

## 2020-08-25 DIAGNOSIS — E785 Hyperlipidemia, unspecified: Secondary | ICD-10-CM

## 2020-08-25 DIAGNOSIS — I10 Essential (primary) hypertension: Secondary | ICD-10-CM

## 2020-08-25 DIAGNOSIS — Z6835 Body mass index (BMI) 35.0-35.9, adult: Secondary | ICD-10-CM

## 2020-08-25 MED ORDER — AMLODIPINE BESYLATE 10 MG PO TABS: 10.0000 mg | ORAL_TABLET | Freq: Every day | ORAL | 3 refills | Status: DC

## 2020-08-25 MED ORDER — ATORVASTATIN CALCIUM 10 MG PO TABS: 10.0000 mg | ORAL_TABLET | Freq: Every day | ORAL | 3 refills | Status: DC

## 2020-08-25 MED ORDER — SILDENAFIL CITRATE 25 MG PO TABS: ORAL_TABLET | ORAL | 2 refills | Status: DC

## 2020-08-25 MED FILL — SILDENAFIL CITRATE 25 MG TA: 25 | 30 days supply | Qty: 10 | Fill #0

## 2020-08-25 NOTE — Patient Instructions (Signed)
Insomnia Insomnia is a sleep disorder that makes it difficult to fall asleep or stay asleep. Insomnia can cause fatigue, low energy, difficulty concentrating, mood swings, and poor performance at work or school. There are three different ways to classify insomnia:  Difficulty falling asleep.  Difficulty staying asleep.  Waking up too early in the morning. Any type of insomnia can be long-term (chronic) or short-term (acute). Both are common. Short-term insomnia usually lasts for three months or less. Chronic insomnia occurs at least three times a week for longer than three months. What are the causes? Insomnia may be caused by another condition, situation, or substance, such as:  Anxiety.  Certain medicines.  Gastroesophageal reflux disease (GERD) or other gastrointestinal conditions.  Asthma or other breathing conditions.  Restless legs syndrome, sleep apnea, or other sleep disorders.  Chronic pain.  Menopause.  Stroke.  Abuse of alcohol, tobacco, or illegal drugs.  Mental health conditions, such as depression.  Caffeine.  Neurological disorders, such as Alzheimer's disease.  An overactive thyroid (hyperthyroidism). Sometimes, the cause of insomnia may not be known. What increases the risk? Risk factors for insomnia include:  Gender. Women are affected more often than men.  Age. Insomnia is more common as you get older.  Stress.  Lack of exercise.  Irregular work schedule or working night shifts.  Traveling between different time zones.  Certain medical and mental health conditions. What are the signs or symptoms? If you have insomnia, the main symptom is having trouble falling asleep or having trouble staying asleep. This may lead to other symptoms, such as:  Feeling fatigued or having low energy.  Feeling nervous about going to sleep.  Not feeling rested in the morning.  Having trouble concentrating.  Feeling irritable, anxious, or depressed. How  is this diagnosed? This condition may be diagnosed based on:  Your symptoms and medical history. Your health care provider may ask about: ? Your sleep habits. ? Any medical conditions you have. ? Your mental health.  A physical exam. How is this treated? Treatment for insomnia depends on the cause. Treatment may focus on treating an underlying condition that is causing insomnia. Treatment may also include:  Medicines to help you sleep.  Counseling or therapy.  Lifestyle adjustments to help you sleep better. Follow these instructions at home: Eating and drinking   Limit or avoid alcohol, caffeinated beverages, and cigarettes, especially close to bedtime. These can disrupt your sleep.  Do not eat a large meal or eat spicy foods right before bedtime. This can lead to digestive discomfort that can make it hard for you to sleep. Sleep habits   Keep a sleep diary to help you and your health care provider figure out what could be causing your insomnia. Write down: ? When you sleep. ? When you wake up during the night. ? How well you sleep. ? How rested you feel the next day. ? Any side effects of medicines you are taking. ? What you eat and drink.  Make your bedroom a dark, comfortable place where it is easy to fall asleep. ? Put up shades or blackout curtains to block light from outside. ? Use a white noise machine to block noise. ? Keep the temperature cool.  Limit screen use before bedtime. This includes: ? Watching TV. ? Using your smartphone, tablet, or computer.  Stick to a routine that includes going to bed and waking up at the same times every day and night. This can help you fall asleep faster. Consider  making a quiet activity, such as reading, part of your nighttime routine.  Try to avoid taking naps during the day so that you sleep better at night.  Get out of bed if you are still awake after 15 minutes of trying to sleep. Keep the lights down, but try reading or  doing a quiet activity. When you feel sleepy, go back to bed. General instructions  Take over-the-counter and prescription medicines only as told by your health care provider.  Exercise regularly, as told by your health care provider. Avoid exercise starting several hours before bedtime.  Use relaxation techniques to manage stress. Ask your health care provider to suggest some techniques that may work well for you. These may include: ? Breathing exercises. ? Routines to release muscle tension. ? Visualizing peaceful scenes.  Make sure that you drive carefully. Avoid driving if you feel very sleepy.  Keep all follow-up visits as told by your health care provider. This is important. Contact a health care provider if:  You are tired throughout the day.  You have trouble in your daily routine due to sleepiness.  You continue to have sleep problems, or your sleep problems get worse. Get help right away if:  You have serious thoughts about hurting yourself or someone else. If you ever feel like you may hurt yourself or others, or have thoughts about taking your own life, get help right away. You can go to your nearest emergency department or call:  Your local emergency services (911 in the [place]  A suicide crisis helpline, such as the [facility] at (336)179-6596. This is open 24 hours a day. Summary  Insomnia is a sleep disorder that makes it difficult to fall asleep or stay asleep.  Insomnia can be long-term (chronic) or short-term (acute).  Treatment for insomnia depends on the cause. Treatment may focus on treating an underlying condition that is causing insomnia.  Keep a sleep diary to help you and your health care provider figure out what could be causing your insomnia. This information is not intended to replace advice given to you by your health care provider. Make sure you discuss any questions you have with your health care provider. Document  Revised: 07/18/2017 Document Reviewed: 05/15/2017 Elsevier Patient Education  2020 [place] Following a healthy eating pattern may help you to achieve and maintain a healthy body weight, reduce the risk of chronic disease, and live a long and productive life. It is important to follow a healthy eating pattern at an appropriate calorie level for your body. Your nutritional needs should be met primarily through food by choosing a variety of nutrient-rich foods. What are tips for following this plan? Reading food labels  Read labels and choose the following: ? Reduced or low sodium. ? Juices with 100% fruit juice. ? Foods with low saturated fats and high polyunsaturated and monounsaturated fats. ? Foods with whole grains, such as whole wheat, cracked wheat, brown rice, and wild rice. ? Whole grains that are fortified with folic acid. This is recommended for women who are pregnant or who want to become pregnant.  Read labels and avoid the following: ? Foods with a lot of added sugars. These include foods that contain brown sugar, corn sweetener, corn syrup, dextrose, fructose, glucose, high-fructose corn syrup, honey, invert sugar, lactose, malt syrup, maltose, molasses, raw sugar, sucrose, trehalose, or turbinado sugar.  Do not eat more than the following amounts of added sugar per day:  6  teaspoons (25 g) for women.  9 teaspoons (38 g) for men. ? Foods that contain processed or refined starches and grains. ? Refined grain products, such as white flour, degermed cornmeal, white bread, and white rice. Shopping  Choose nutrient-rich snacks, such as vegetables, whole fruits, and nuts. Avoid high-calorie and high-sugar snacks, such as potato chips, fruit snacks, and candy.  Use oil-based dressings and spreads on foods instead of solid fats such as butter, stick margarine, or cream cheese.  Limit pre-made sauces, mixes, and "instant" products such as flavored rice,  instant noodles, and ready-made pasta.  Try more plant-protein sources, such as tofu, tempeh, black beans, edamame, lentils, nuts, and seeds.  Explore eating plans such as the Mediterranean diet or vegetarian diet. Cooking  Use oil to saut or stir-fry foods instead of solid fats such as butter, stick margarine, or lard.  Try baking, boiling, grilling, or broiling instead of frying.  Remove the fatty part of meats before cooking.  Steam vegetables in water or broth. Meal planning   At meals, imagine dividing your plate into fourths: ? One-half of your plate is fruits and vegetables. ? One-fourth of your plate is whole grains. ? One-fourth of your plate is protein, especially lean meats, poultry, eggs, tofu, beans, or nuts.  Include low-fat dairy as part of your daily diet. Lifestyle  Choose healthy options in all settings, including home, work, school, restaurants, or stores.  Prepare your food safely: ? Wash your hands after handling raw meats. ? Keep food preparation surfaces clean by regularly washing with hot, soapy water. ? Keep raw meats separate from ready-to-eat foods, such as fruits and vegetables. ? Cook seafood, meat, poultry, and eggs to the recommended internal temperature. ? Store foods at safe temperatures. In general:  Keep cold foods at 5F (4.4C) or below.  Keep hot foods at 15F (60C) or above.  Keep your freezer at [facility] (-17.8C) or below.  Foods are no longer safe to eat when they have been between the temperatures of 40-15F (4.4-60C) for more than 2 hours. What foods should I eat? Fruits Aim to eat 2 cup-equivalents of fresh, canned (in natural juice), or frozen fruits each day. Examples of 1 cup-equivalent of fruit include 1 small apple, 8 large strawberries, 1 cup canned fruit,  cup dried fruit, or 1 cup 100% juice. Vegetables Aim to eat 2-3 cup-equivalents of fresh and frozen vegetables each day, including different varieties and colors.  Examples of 1 cup-equivalent of vegetables include 2 medium carrots, 2 cups raw, leafy greens, 1 cup chopped vegetable (raw or cooked), or 1 medium baked potato. Grains Aim to eat 6 ounce-equivalents of whole grains each day. Examples of 1 ounce-equivalent of grains include 1 slice of bread, 1 cup ready-to-eat cereal, 3 cups popcorn, or  cup cooked rice, pasta, or cereal. Meats and other proteins Aim to eat 5-6 ounce-equivalents of protein each day. Examples of 1 ounce-equivalent of protein include 1 egg, 1/2 cup nuts or seeds, or 1 tablespoon (16 g) peanut butter. A cut of meat or fish that is the size of a deck of cards is about 3-4 ounce-equivalents.  Of the protein you eat each week, try to have at least 8 ounces come from seafood. This includes salmon, trout, herring, and anchovies. Dairy Aim to eat 3 cup-equivalents of fat-free or low-fat dairy each day. Examples of 1 cup-equivalent of dairy include 1 cup (240 mL) milk, 8 ounces (250 g) yogurt, 1 ounces (44 g) natural cheese, or 1  cup (240 mL) fortified soy milk. Fats and oils  Aim for about 5 teaspoons (21 g) per day. Choose monounsaturated fats, such as canola and olive oils, avocados, peanut butter, and most nuts, or polyunsaturated fats, such as sunflower, corn, and soybean oils, walnuts, pine nuts, sesame seeds, sunflower seeds, and flaxseed. Beverages  Aim for six 8-oz glasses of water per day. Limit coffee to three to five 8-oz cups per day.  Limit caffeinated beverages that have added calories, such as soda and energy drinks.  Limit alcohol intake to no more than 1 drink a day for nonpregnant women and 2 drinks a day for men. One drink equals 12 oz of beer (355 mL), 5 oz of wine (148 mL), or 1 oz of hard liquor (44 mL). Seasoning and other foods  Avoid adding excess amounts of salt to your foods. Try flavoring foods with herbs and spices instead of salt.  Avoid adding sugar to foods.  Try using oil-based dressings, sauces,  and spreads instead of solid fats. This information is based on general [place] nutrition guidelines. For more information, visit [employer]. Exact amounts may vary based on your nutrition needs. Summary  A healthy eating plan may help you to maintain a healthy weight, reduce the risk of chronic diseases, and stay active throughout your life.  Plan your meals. Make sure you eat the right portions of a variety of nutrient-rich foods.  Try baking, boiling, grilling, or broiling instead of frying.  Choose healthy options in all settings, including home, work, school, restaurants, or stores. This information is not intended to replace advice given to you by your health care provider. Make sure you discuss any questions you have with your health care provider. Document Revised: 11/17/2017 Document Reviewed: 11/17/2017 Elsevier Patient Education  [place].

## 2020-08-25 NOTE — Progress Notes (Signed)
Patient ID: Peter Friedman, male    DOB: 04/21/1966  MRN: [id number]  CC: Hypertension   Subjective: Peter Friedman is a 55 y.o. male who presents for chronic ds management His concerns today include:  Patient with history of HTN,formertob dep,pre-DM,HL,ED, chronic LBP withRTside sciatica, right knee painsince7/2016  HM: did not get FIT test when he came to lab after last visit.  Got his COVID booster last mth at [facility] but does not have his card with him for me to update that in the system.  HYPERTENSION Currently taking: see medication list Med Adherence: [x]  Yes compliant with Norvasc.   []  No Medication side effects: []  Yes    [x]  No Adherence with salt restriction: [x]  Yes    []  No Home Monitoring?: []  Yes    [x]  No Monitoring Frequency: []  Yes    []  No Home BP results range: []  Yes    []  No SOB? []  Yes    [x]  No Chest Pain?: []  Yes    [x]  No Leg swelling?: []  Yes    [x]  No Headaches?: []  Yes    [x]  No Dizziness? []  Yes    [x]  No Comments:   HL: Reportedly taking an tolerating Lipitor  Obesity: Weight has remained fairly stable.  He discontinued going to the gym because of the increased cases of Covid infection going on at this time.  However he has started walking in his neighborhood 3 to 4 days a week for about 30 minutes.  Feels he does okay with his eating habits.  He eats 2 meals a day.  He drinks mainly water.  He incorporates vegetables into his diet.  Meats are usually baked or broiled.  C/o problems with sleep for past 1-2 mths.  States that he gets hot during the night and constantly having to come out from under his blanket then he gets cold and has to get back under the blanket.  He usually gets in bed around 9-10 p.m PM at night.  He keeps his TV on and then turns it off but sometimes he falls asleep with the TV on.  He does not drink alcoholic beverages.  Sometimes at night he drinks tea with a little honey in it to see if it would help him sleep  but it does not help.  Requests refill on Viagra.  Patient Active Problem List   Diagnosis Date Noted  . Former smoker 08/18/2019  . Hyperlipidemia 09/26/2017  . Facet arthritis of lumbar region 02/03/2017  . Erectile dysfunction 04/30/2016  . Plantar fasciitis, left 04/05/2016  . Seasonal allergies 02/01/2016  . HTN (hypertension) 11/28/2015  . Prediabetes 10/27/2015  . Degenerative tear of medial meniscus of right knee 08/24/2014  . Tobacco abuse 08/24/2014  . Chronic pain of right knee 07/13/2014     Current Outpatient Medications on File Prior to Visit  Medication Sig Dispense Refill  . albuterol (PROVENTIL HFA;VENTOLIN HFA) 108 (90 Base) MCG/ACT inhaler Inhale 2 puffs into the lungs every 6 (six) hours as needed for wheezing or shortness of breath. 54 g 3  . amLODipine (NORVASC) 10 MG tablet Take 1 tablet (10 mg total) by mouth daily. 30 tablet 1  . atorvastatin (LIPITOR) 10 MG tablet Take 1 tablet (10 mg total) by mouth daily. 90 tablet 0  . diclofenac sodium (VOLTAREN) 1 % GEL Apply 2 g topically 4 (four) times daily. 100 g 1  . meloxicam (MOBIC) 15 MG tablet Take 1 tablet (15 mg total) by  mouth daily. Prn pain 30 tablet 2  . methocarbamol (ROBAXIN) 500 MG tablet Take 2 tablets (1,000 mg total) by mouth every 8 (eight) hours as needed for muscle spasms. 60 tablet 1  . naproxen (NAPROSYN) 500 MG tablet Take 1 tablet (500 mg total) by mouth 2 (two) times daily with a meal. 30 tablet 0  . sildenafil (VIAGRA) 25 MG tablet Take 4 tablets (100 mg total) by mouth daily as needed for erectile dysfunction. 40 tablet 2   No current facility-administered medications on file prior to visit.    Allergies  Allergen Reactions  . Chantix [Varenicline] Other (See Comments)    Bad thoughts    Social History   Socioeconomic History  . Marital status: Single    Spouse name: Not on file  . Number of children: Not on file  . Years of education: Not on file  . Highest education level:  Not on file  Occupational History  . Not on file  Tobacco Use  . Smoking status: Former Smoker    Packs/day: 0.50    Types: Cigarettes  . Smokeless tobacco: Never Used  Vaping Use  . Vaping Use: Never used  Substance and Sexual Activity  . Alcohol use: No  . Drug use: No  . Sexual activity: Not on file  Other Topics Concern  . Not on file  Social History Narrative  . Not on file   Social Determinants of Health   Financial Resource Strain: Not on file  Food Insecurity: Not on file  Transportation Needs: Not on file  Physical Activity: Not on file  Stress: Not on file  Social Connections: Not on file  Intimate Partner Violence: Not on file    Family History  Problem Relation Age of Onset  . Hypertension Mother   . Hypertension Father   . Lung cancer Brother   . Hypertension Sister     Past Surgical History:  Procedure Laterality Date  . KNEE ARTHROSCOPY Right 02/2015    ROS: Review of Systems Negative except as stated above  PHYSICAL EXAM: BP 124/74   Pulse 81   Temp 98.2 F (36.8 C)   Resp 16   Wt 237 lb 6.4 oz (107.7 kg)   SpO2 98%   BMI 35.06 kg/m   Wt Readings from Last 3 Encounters:  08/25/20 237 lb 6.4 oz (107.7 kg)  12/16/19 238 lb 9.6 oz (108.2 kg)  08/18/19 241 lb 9.6 oz (109.6 kg)    Physical Exam  General appearance - alert, well appearing, and in no distress Mental status - normal mood, behavior, speech, dress, motor activity, and thought processes Eyes - pupils equal and reactive, extraocular eye movements intact Neck - supple, no significant adenopathy Chest - clear to auscultation, no wheezes, rales or rhonchi, symmetric air entry Heart - normal rate, regular rhythm, normal S1, S2, no murmurs, rubs, clicks or gallops Extremities - peripheral pulses normal, no pedal edema, no clubbing or cyanosis  CMP Latest Ref Rng & Units 04/26/2020 06/16/2019 06/26/2017  Glucose 65 - 99 mg/dL 696(E) 93 952(W)  BUN 6 - 24 mg/dL 12 10 11   Creatinine  0.76 - 1.27 mg/dL 4.13 2.44  Sodium 134 - 144 mmol/L 135 136 139  Potassium 3.5 - 5.2 mmol/L 4.4 4.6 4.5  Chloride 96 - 106 mmol/L 101 102 103  CO2 20 - 29 mmol/L 23 23 21   Calcium 8.7 - 10.2 mg/dL 9.3 9.4 9.4  Total Protein 6.0 - 8.5 g/dL 7.0 7.1 6.8  Total Bilirubin 0.0 - 1.2 mg/dL <0.2 0.2 <0.2  Alkaline Phos 48 - 121 IU/L 63 65 56  AST 0 - 40 IU/L 12 15 14   ALT 0 - 44 IU/L 15 17 12    Lipid Panel     Component Value Date/Time   CHOL 148 04/26/2020 1039   TRIG 66 04/26/2020 1039   HDL 40 04/26/2020 1039   CHOLHDL 3.7 04/26/2020 1039   LDLCALC 95 04/26/2020 1039    CBC    Component Value Date/Time   WBC 7.7 04/26/2020 1039   RBC 4.98 04/26/2020 1039   HGB 13.6 04/26/2020 1039   HCT 41.0 04/26/2020 1039   PLT 352 04/26/2020 1039   MCV 82 04/26/2020 1039   MCH 27.3 04/26/2020 1039   MCHC 33.2 04/26/2020 1039   RDW 13.1 04/26/2020 1039   LYMPHSABS 3.0 06/16/2019 0911   EOSABS 0.1 06/16/2019 0911   BASOSABS 0.0 06/16/2019 0911    ASSESSMENT AND PLAN: 1. Essential hypertension At goal.  Continue amlodipine - amLODipine (NORVASC) 10 MG tablet; Take 1 tablet (10 mg total) by mouth daily.  Dispense: 90 tablet; Refill: 3  2. Hyperlipidemia, unspecified hyperlipidemia type - atorvastatin (LIPITOR) 10 MG tablet; Take 1 tablet (10 mg total) by mouth daily.  Dispense: 90 tablet; Refill: 3  3. Other insomnia Good sleep hygiene discussed and encouraged.  Advised that he may need to keep a fan on the side of his bed at night to help keep him cool.  I recommend once he gets in bed to turn off all lights and sounds including his television.  Advised against drinking caffeinated beverages at night switch the tea that he drinks has caffeine in it.  4. Class 2 severe obesity due to excess calories with serious comorbidity and body mass index (BMI) of 35.0 to 35.9 in adult [facility]) Encouraged him to continue regular exercise as he has been doing.  He seems to be doing okay with his  eating habits but further tips given and printed information also given.  5. Screening for colon cancer - Fecal occult blood, imunochemical([facility])  6. COVID-19 vaccine series completed On his next visit he will bring his card with him so that we can update the day that he received the booster shot.  7. Erectile dysfunction, unspecified erectile dysfunction type - sildenafil (VIAGRA) 25 MG tablet; 2 tabs PO 1/2 hr prior to intercourse PRN.  Limit use to 2 tabs/24 hr  Dispense: 10 tablet; Refill: 2   Patient was given the opportunity to ask questions.  Patient verbalized understanding of the plan and was able to repeat key elements of the plan.   No orders of the defined types were placed in this encounter.    Requested Prescriptions    No prescriptions requested or ordered in this encounter    No follow-ups on file.  Karle Plumber, MD, FACP

## 2020-09-25 MED FILL — ?ATORVASTATIN 10 MG TABLET: 10 | 30 days supply | Qty: 30 | Fill #2

## 2020-09-25 MED FILL — AMLODIPINE BESYLATE 10 MG T: 10 | 30 days supply | Qty: 30 | Fill #0

## 2020-10-25 MED FILL — AMLODIPINE BESYLATE 10 MG T: 10 | 30 days supply | Qty: 30 | Fill #1

## 2020-10-25 MED FILL — ?ATORVASTATIN 10 MG TABLET: 10 | 30 days supply | Qty: 30 | Fill #0

## 2020-11-18 ENCOUNTER — Other Ambulatory Visit: Payer: Self-pay

## 2020-12-22 ENCOUNTER — Other Ambulatory Visit: Payer: Self-pay

## 2020-12-22 MED FILL — Atorvastatin Calcium Tab 10 MG (Base Equivalent): ORAL | 30 days supply | Qty: 30 | Fill #0 | Status: AC

## 2020-12-22 MED FILL — Amlodipine Besylate Tab 10 MG (Base Equivalent): ORAL | 30 days supply | Qty: 30 | Fill #0 | Status: AC

## 2020-12-22 MED FILL — Atorvastatin Calcium Tab 10 MG (Base Equivalent): ORAL | 30 days supply | Qty: 30 | Fill #0 | Status: CN

## 2020-12-25 ENCOUNTER — Other Ambulatory Visit: Payer: Self-pay

## 2020-12-25 ENCOUNTER — Ambulatory Visit: Payer: Self-pay | Admitting: Internal Medicine

## 2022-02-07 ENCOUNTER — Ambulatory Visit (HOSPITAL_COMMUNITY)
Admission: EM | Admit: 2022-02-07 | Discharge: 2022-02-07 | Disposition: A | Discharge status: Home / Self Care | Payer: Self-pay | Attending: Family Medicine | Admitting: Family Medicine

## 2022-02-07 ENCOUNTER — Encounter (HOSPITAL_COMMUNITY): Payer: Self-pay

## 2022-02-07 ENCOUNTER — Ambulatory Visit (INDEPENDENT_AMBULATORY_CARE_PROVIDER_SITE_OTHER): Payer: Self-pay

## 2022-02-07 DIAGNOSIS — S20211A Contusion of right front wall of thorax, initial encounter: Secondary | ICD-10-CM

## 2022-02-07 DIAGNOSIS — R0782 Intercostal pain: Secondary | ICD-10-CM

## 2022-02-07 MED ORDER — OXYCODONE-ACETAMINOPHEN 5-325 MG PO TABS: 1.0000 | ORAL_TABLET | Freq: Four times a day (QID) | ORAL | 0 refills | Status: AC | PRN

## 2022-02-07 NOTE — ED Provider Notes (Signed)
[facility]   [id number] 02/07/22 Arrival Time: 1218  ASSESSMENT & PLAN:  1. Rib contusion, right, initial encounter    I have personally viewed the imaging studies ordered this visit. No appreciable rib fractures. No pneumothorax.  Encouraged deep breaths. Ibuprofen for baseline pain. As needed: Meds ordered this encounter  Medications   oxyCODONE-acetaminophen (PERCOCET/ROXICET) 5-325 MG tablet    Sig: Take 1 tablet by mouth every 6 (six) hours as needed for severe pain.    Dispense:  12 tablet    Refill:  0   Recommend:  Follow-up Information     [facility] at [facility].   Specialty: Urgent Care Why: If worsening or failing to improve as anticipated. Contact information: [address] [place] [address] [phone]              /  Reviewed expectations re: course of current medical issues. Questions answered. Outlined signs and symptoms indicating need for more acute intervention. Patient verbalized understanding. After Visit Summary given.   SUBJECTIVE:  History from: patient. Peter Friedman is a 56 y.o. male who presents with complaint of R rib pain s/p fall on stairs 2 d ago. Almost any movement hurts. Ibuprofen with some relief. Trouble sleeping secondary to pain. No SOB.  Social History   Tobacco Use  Smoking Status Former   Packs/day: 0.50   Types: Cigarettes  Smokeless Tobacco Never   Social History   Substance and Sexual Activity  Alcohol Use No      OBJECTIVE:  Vitals:   02/07/22 1233  BP: (!) 168/90  Pulse: 74  Resp: 18  Temp: 98 F (36.7 C)  TempSrc: Oral  SpO2: 97%    General appearance: alert, oriented, no acute distress but appears uncomfortable Eyes: PERRLA; EOMI; conjunctivae normal HENT: normocephalic; atraumatic Neck: supple with FROM Lungs: without labored respirations; speaks full sentences without difficulty; CTAB Abd: benign Neuro: normal  gait Psychological: alert and cooperative; normal mood and affect   Imaging: DG Ribs Unilateral W/Chest Right  Result Date: 02/07/2022 CLINICAL DATA:  Trauma, fall EXAM: RIGHT RIBS AND CHEST - 3+ VIEW COMPARISON:  11/01/2015 FINDINGS: Cardiac size is within normal limits. There are no signs of pulmonary edema or focal pulmonary consolidation. There is no pleural effusion or pneumothorax. No displaced fractures are seen in the right ribs. IMPRESSION: No displaced fractures are seen in the right ribs. There is no focal pulmonary infiltrate. There is no pleural effusion or pneumothorax. Electronically Signed   By: Ernie Avena M.D.   On: 02/07/2022 13:21     Allergies  Allergen Reactions   Chantix [Varenicline] Other (See Comments)    Bad thoughts    History reviewed. No pertinent past medical history. Social History   Socioeconomic History   Marital status: Single    Spouse name: Not on file   Number of children: Not on file   Years of education: Not on file   Highest education level: Not on file  Occupational History   Not on file  Tobacco Use   Smoking status: Former    Packs/day: 0.50    Types: Cigarettes   Smokeless tobacco: Never  Vaping Use   Vaping Use: Never used  Substance and Sexual Activity   Alcohol use: No   Drug use: No   Sexual activity: Not on file  Other Topics Concern   Not on file  Social History Narrative   Not on file   Social Determinants of Health  Financial Resource Strain: Not on file  Food Insecurity: Not on file  Transportation Needs: Not on file  Physical Activity: Not on file  Stress: Not on file  Social Connections: Not on file  Intimate Partner Violence: Not on file   Family History  Problem Relation Age of Onset   Hypertension Mother    Hypertension Father    Lung cancer Brother    Hypertension Sister    Past Surgical History:  Procedure Laterality Date   KNEE ARTHROSCOPY Right 02/2015      Mardella Layman,  MD 02/07/22 1754

## 2022-02-07 NOTE — Discharge Instructions (Signed)
Be aware, you have been prescribed pain medications that may cause drowsiness. While taking this medication, do not take any other medications containing acetaminophen (Tylenol). Do not combine with alcohol or other illicit drugs. Please do not drive, operate heavy machinery, or take part in activities that require making important decisions while on this medication as your judgement may be clouded.  I recommend taking a stool softener while taking your pain medication to prevent constipation.

## 2022-02-07 NOTE — ED Triage Notes (Signed)
Pt states tripped and fell on wooden steps at his clients house and hit his rt rib cage 2 days ago. Pt c/o pain and bruising. Taken ibuprofen and ice hot pads with little relief.
